# Patient Record
Sex: Female | Born: 1935 | Race: White | Hispanic: No | Smoking: Never smoker
Health system: Southern US, Community
[De-identification: ages and names within clinical notes are randomized; demographics above are authoritative.]

## PROBLEM LIST (undated history)

## (undated) DIAGNOSIS — E559 Vitamin D deficiency, unspecified: Secondary | ICD-10-CM

## (undated) DIAGNOSIS — M8580 Other specified disorders of bone density and structure, unspecified site: Secondary | ICD-10-CM

## (undated) DIAGNOSIS — I10 Essential (primary) hypertension: Secondary | ICD-10-CM

## (undated) DIAGNOSIS — K921 Melena: Secondary | ICD-10-CM

## (undated) DIAGNOSIS — I471 Supraventricular tachycardia, unspecified: Secondary | ICD-10-CM

## (undated) DIAGNOSIS — N183 Chronic kidney disease, stage 3 unspecified: Secondary | ICD-10-CM

## (undated) DIAGNOSIS — I2510 Atherosclerotic heart disease of native coronary artery without angina pectoris: Secondary | ICD-10-CM

## (undated) DIAGNOSIS — M1990 Unspecified osteoarthritis, unspecified site: Secondary | ICD-10-CM

## (undated) DIAGNOSIS — G2581 Restless legs syndrome: Secondary | ICD-10-CM

## (undated) DIAGNOSIS — K219 Gastro-esophageal reflux disease without esophagitis: Secondary | ICD-10-CM

## (undated) DIAGNOSIS — Z860100 Personal history of colon polyps, unspecified: Secondary | ICD-10-CM

## (undated) DIAGNOSIS — E039 Hypothyroidism, unspecified: Secondary | ICD-10-CM

## (undated) DIAGNOSIS — E079 Disorder of thyroid, unspecified: Secondary | ICD-10-CM

## (undated) DIAGNOSIS — E785 Hyperlipidemia, unspecified: Secondary | ICD-10-CM

## (undated) HISTORY — PX: CARDIAC CATHETERIZATION: SHX172

## (undated) HISTORY — DX: Disorder of thyroid, unspecified: E07.9

## (undated) HISTORY — DX: Other specified disorders of bone density and structure, unspecified site: M85.80

## (undated) HISTORY — DX: Atherosclerotic heart disease of native coronary artery without angina pectoris: I25.10

## (undated) HISTORY — DX: Supraventricular tachycardia: I47.1

## (undated) HISTORY — PX: OTHER SURGICAL HISTORY: SHX169

## (undated) HISTORY — DX: Personal history of colon polyps, unspecified: Z86.0100

## (undated) HISTORY — DX: Melena: K92.1

## (undated) HISTORY — DX: Personal history of colonic polyps: Z86.010

## (undated) HISTORY — PX: PERIPHERAL IRIDOTOMY: SHX5414

## (undated) HISTORY — DX: Restless legs syndrome: G25.81

## (undated) HISTORY — DX: Hyperlipidemia, unspecified: E78.5

## (undated) HISTORY — DX: Vitamin D deficiency, unspecified: E55.9

## (undated) HISTORY — PX: DILATION AND CURETTAGE OF UTERUS: SHX78

## (undated) HISTORY — DX: Supraventricular tachycardia, unspecified: I47.10

## (undated) HISTORY — PX: TUBAL LIGATION: SHX77

## (undated) HISTORY — PX: JOINT REPLACEMENT: SHX530

## (undated) HISTORY — PX: TOTAL HIP ARTHROPLASTY: SHX124

## (undated) HISTORY — PX: HIP SURGERY: SHX245

## (undated) HISTORY — DX: Essential (primary) hypertension: I10

## (undated) HISTORY — PX: KNEE ARTHROSCOPY: SUR90

---

## 1967-12-28 HISTORY — PX: THYROIDECTOMY, PARTIAL: SHX18

## 2000-05-10 ENCOUNTER — Ambulatory Visit (HOSPITAL_COMMUNITY): Admission: RE | Admit: 2000-05-10 | Discharge: 2000-05-10 | Payer: Self-pay | Admitting: Obstetrics and Gynecology

## 2000-05-10 ENCOUNTER — Encounter (INDEPENDENT_AMBULATORY_CARE_PROVIDER_SITE_OTHER): Payer: Self-pay

## 2002-12-06 ENCOUNTER — Other Ambulatory Visit: Admission: RE | Admit: 2002-12-06 | Discharge: 2002-12-06 | Payer: Self-pay | Admitting: Obstetrics and Gynecology

## 2003-01-08 ENCOUNTER — Encounter: Payer: Self-pay | Admitting: Orthopedic Surgery

## 2003-01-14 ENCOUNTER — Encounter: Payer: Self-pay | Admitting: Orthopedic Surgery

## 2003-01-14 ENCOUNTER — Inpatient Hospital Stay (HOSPITAL_COMMUNITY): Admission: RE | Admit: 2003-01-14 | Discharge: 2003-01-18 | Payer: Self-pay | Admitting: Orthopedic Surgery

## 2003-12-09 ENCOUNTER — Other Ambulatory Visit: Admission: RE | Admit: 2003-12-09 | Discharge: 2003-12-09 | Payer: Self-pay | Admitting: Obstetrics and Gynecology

## 2004-12-10 ENCOUNTER — Other Ambulatory Visit: Admission: RE | Admit: 2004-12-10 | Discharge: 2004-12-10 | Payer: Self-pay | Admitting: *Deleted

## 2005-04-05 ENCOUNTER — Inpatient Hospital Stay (HOSPITAL_COMMUNITY): Admission: RE | Admit: 2005-04-05 | Discharge: 2005-04-08 | Payer: Self-pay | Admitting: Orthopedic Surgery

## 2005-10-27 ENCOUNTER — Emergency Department (HOSPITAL_COMMUNITY): Admission: EM | Admit: 2005-10-27 | Discharge: 2005-10-27 | Payer: Self-pay | Admitting: Emergency Medicine

## 2005-12-13 ENCOUNTER — Other Ambulatory Visit: Admission: RE | Admit: 2005-12-13 | Discharge: 2005-12-13 | Payer: Self-pay | Admitting: Obstetrics and Gynecology

## 2006-02-02 ENCOUNTER — Encounter (INDEPENDENT_AMBULATORY_CARE_PROVIDER_SITE_OTHER): Payer: Self-pay | Admitting: *Deleted

## 2006-02-02 ENCOUNTER — Ambulatory Visit (HOSPITAL_COMMUNITY): Admission: RE | Admit: 2006-02-02 | Discharge: 2006-02-02 | Payer: Self-pay | Admitting: Gastroenterology

## 2006-05-20 IMAGING — CR DG CHEST 2V
2 series · 2 of 2 positions shown · non-contrast
Comparison: none

CLINICAL DATA: Osteoarthritis right hip.  Preoperative respiratory evaluation.  No chest complaints.
 TWO VIEW CHEST:
 Comparison is made to prior study dated 01/08/03.
 Heart size is normal.  Aorta is unfolded.  Lungs are well-expanded and free of active disease.  No pleural effusion is noted.  There is degenerative change in the spine.  Soft tissues are normal.

[view not recorded (1 of 2)]
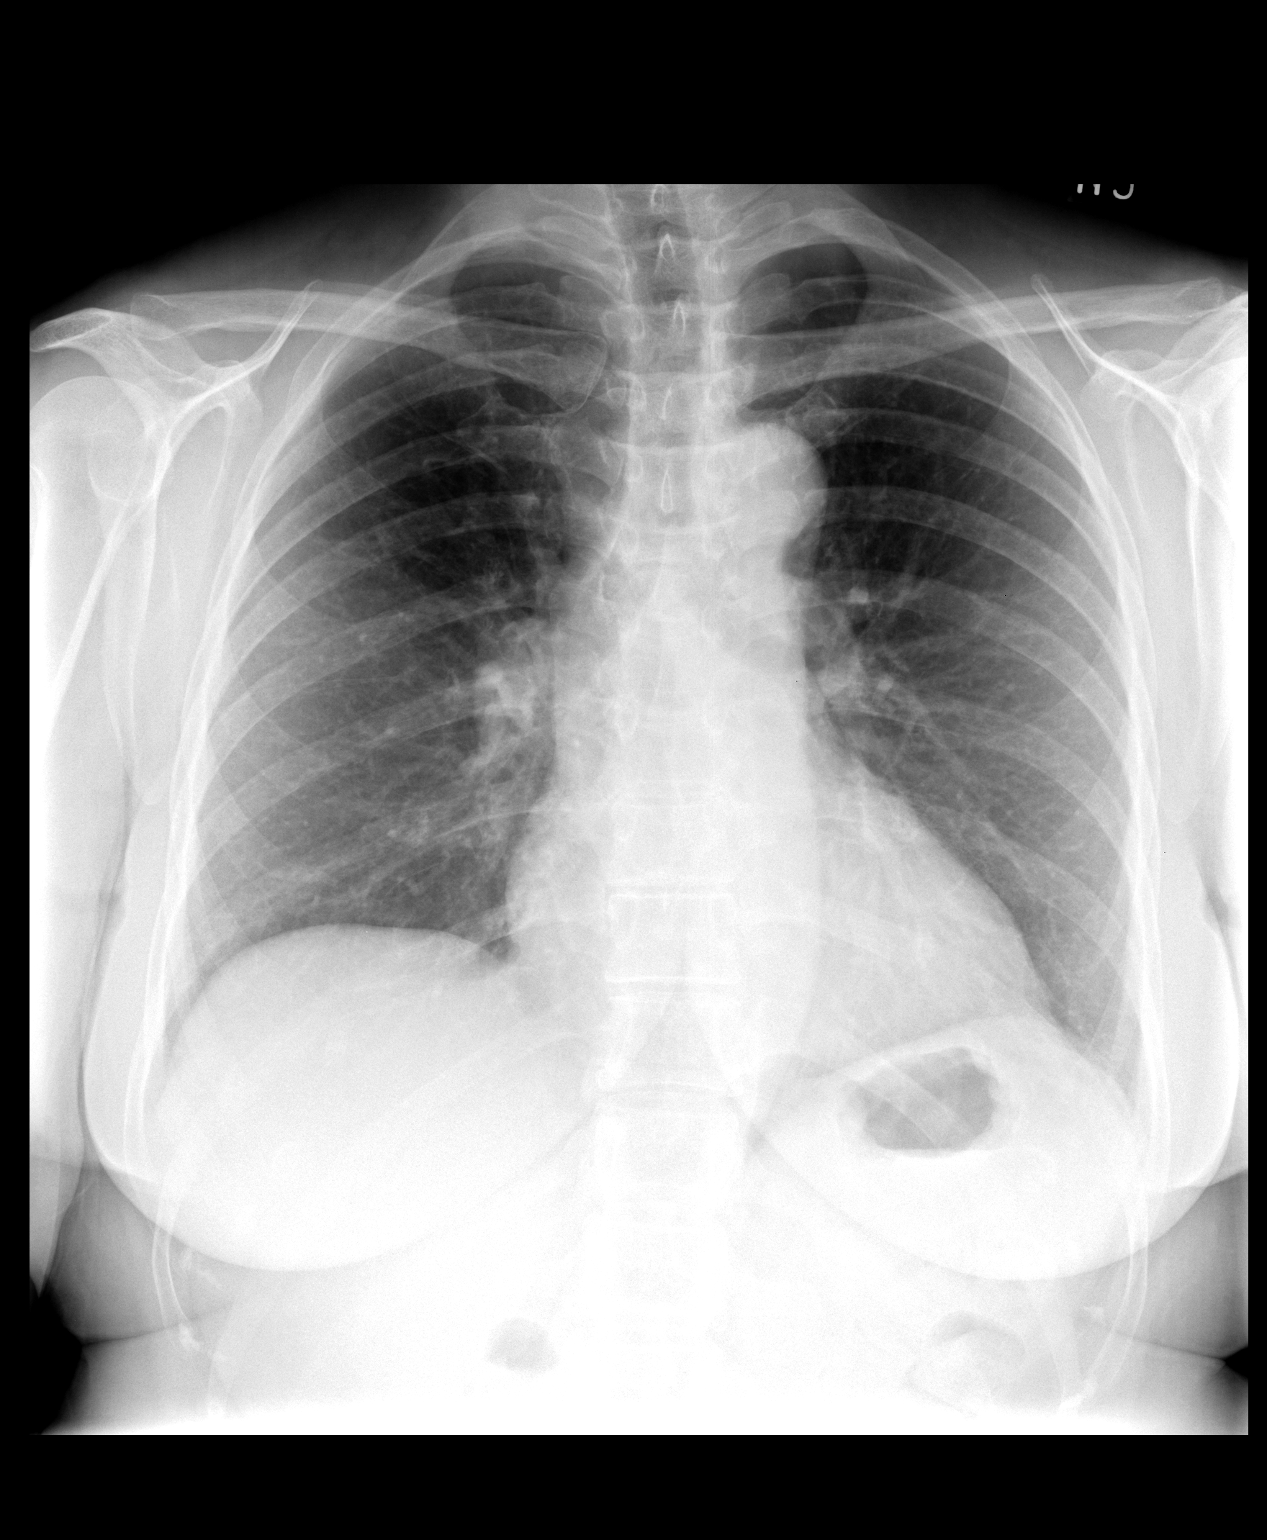

[view not recorded (2 of 2)]
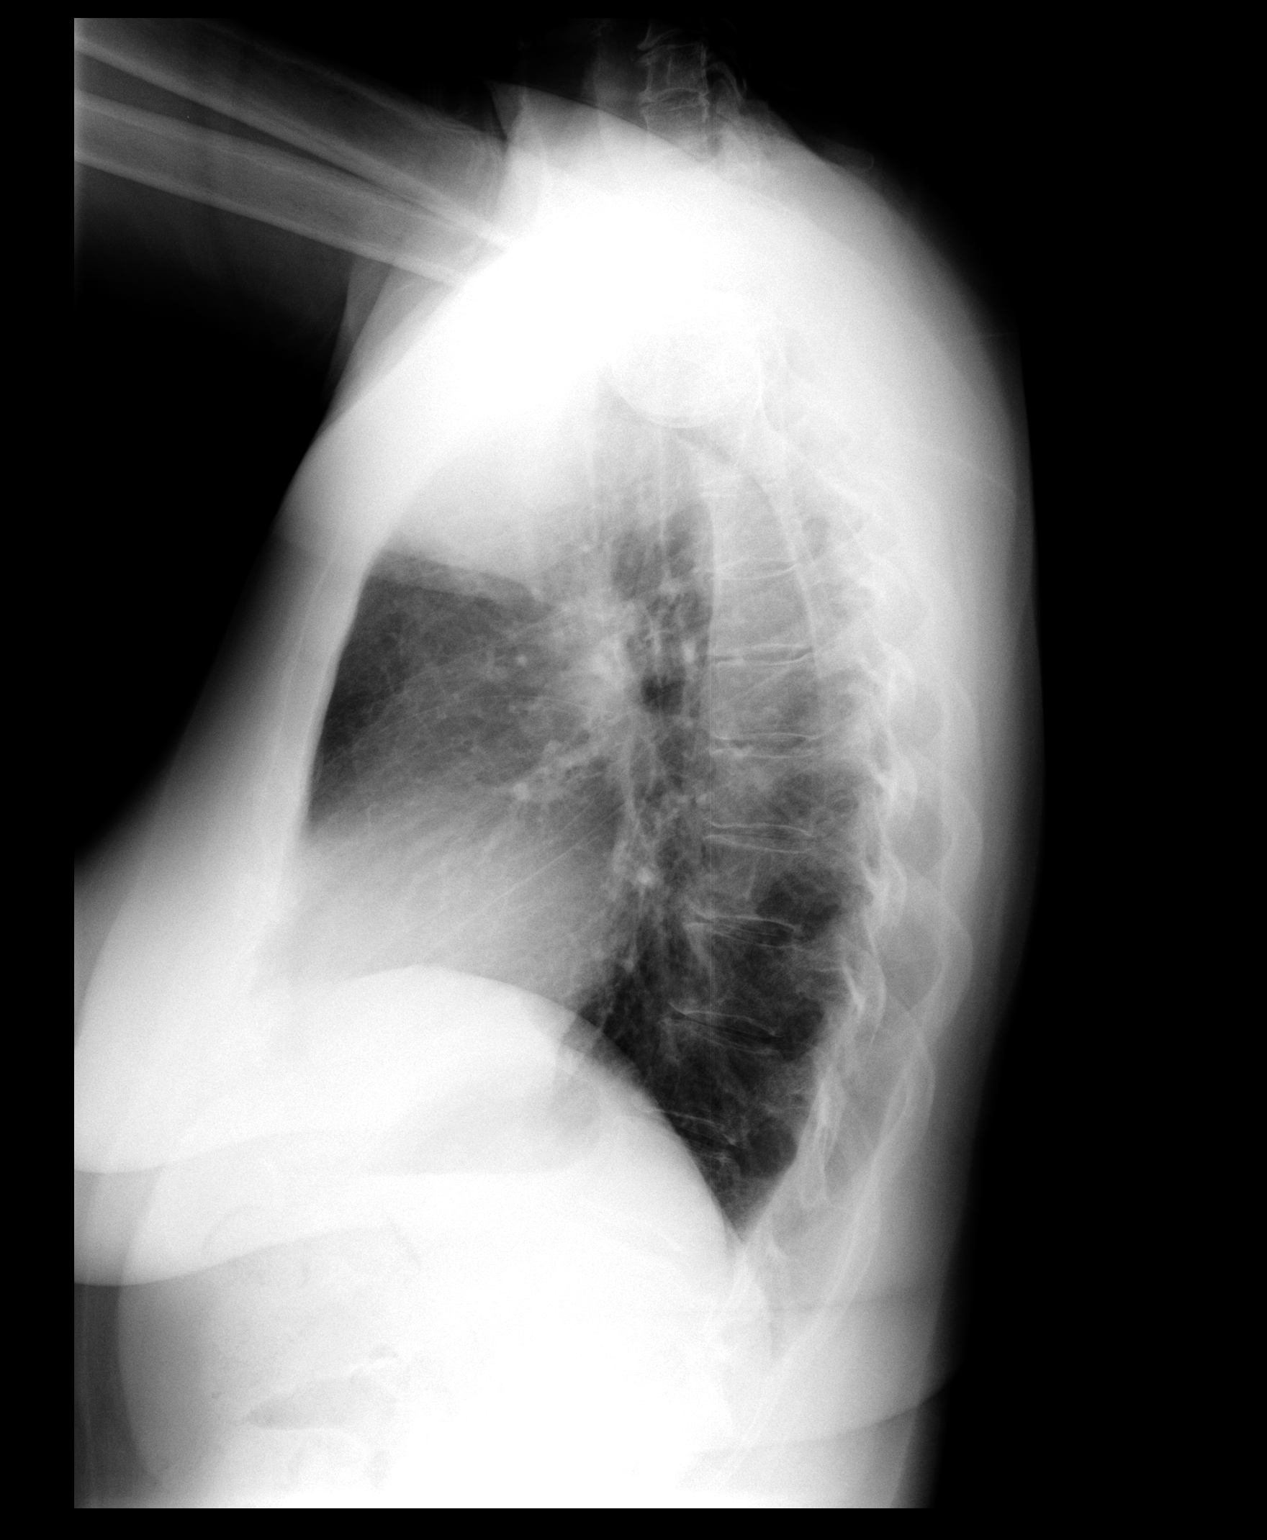

[2 of 2 positions shown; findings below may reference images not displayed]

IMPRESSION: No active cardiopulmonary disease.

## 2006-12-15 ENCOUNTER — Other Ambulatory Visit: Admission: RE | Admit: 2006-12-15 | Discharge: 2006-12-15 | Payer: Self-pay | Admitting: Obstetrics & Gynecology

## 2007-07-08 ENCOUNTER — Other Ambulatory Visit: Payer: Self-pay | Admitting: Cardiology

## 2007-07-08 ENCOUNTER — Inpatient Hospital Stay (HOSPITAL_COMMUNITY): Admission: EM | Admit: 2007-07-08 | Discharge: 2007-07-10 | Payer: Self-pay | Admitting: Emergency Medicine

## 2007-07-09 ENCOUNTER — Encounter (INDEPENDENT_AMBULATORY_CARE_PROVIDER_SITE_OTHER): Payer: Self-pay | Admitting: Cardiology

## 2007-08-24 ENCOUNTER — Ambulatory Visit: Payer: Self-pay | Admitting: Internal Medicine

## 2007-09-04 ENCOUNTER — Ambulatory Visit (HOSPITAL_COMMUNITY): Admission: RE | Admit: 2007-09-04 | Discharge: 2007-09-04 | Payer: Self-pay | Admitting: Endocrinology

## 2007-09-28 ENCOUNTER — Ambulatory Visit (HOSPITAL_COMMUNITY): Admission: RE | Admit: 2007-09-28 | Discharge: 2007-09-28 | Payer: Self-pay | Admitting: Orthopedic Surgery

## 2008-01-19 ENCOUNTER — Other Ambulatory Visit: Admission: RE | Admit: 2008-01-19 | Discharge: 2008-01-19 | Payer: Self-pay | Admitting: Obstetrics & Gynecology

## 2009-01-24 ENCOUNTER — Other Ambulatory Visit: Admission: RE | Admit: 2009-01-24 | Discharge: 2009-01-24 | Payer: Self-pay | Admitting: Obstetrics & Gynecology

## 2011-01-26 ENCOUNTER — Other Ambulatory Visit: Payer: Self-pay | Admitting: Physical Medicine and Rehabilitation

## 2011-01-26 DIAGNOSIS — M545 Low back pain: Secondary | ICD-10-CM

## 2011-01-27 ENCOUNTER — Ambulatory Visit
Admission: RE | Admit: 2011-01-27 | Discharge: 2011-01-27 | Disposition: A | Discharge status: Home / Self Care | Payer: Medicare Other | Source: Ambulatory Visit | Attending: Physical Medicine and Rehabilitation | Admitting: Physical Medicine and Rehabilitation

## 2011-01-27 DIAGNOSIS — M545 Low back pain, unspecified: Secondary | ICD-10-CM

## 2011-02-16 ENCOUNTER — Other Ambulatory Visit: Payer: Self-pay | Admitting: Specialist

## 2011-02-16 ENCOUNTER — Ambulatory Visit (HOSPITAL_COMMUNITY)
Admission: RE | Admit: 2011-02-16 | Discharge: 2011-02-16 | Disposition: A | Discharge status: Home / Self Care | Payer: Medicare Other | Source: Ambulatory Visit | Attending: Specialist | Admitting: Specialist

## 2011-02-16 ENCOUNTER — Other Ambulatory Visit (HOSPITAL_COMMUNITY): Payer: Self-pay | Admitting: Specialist

## 2011-02-16 ENCOUNTER — Encounter (HOSPITAL_COMMUNITY): Payer: Medicare Other

## 2011-02-16 DIAGNOSIS — Z01818 Encounter for other preprocedural examination: Secondary | ICD-10-CM

## 2011-02-16 DIAGNOSIS — Z01812 Encounter for preprocedural laboratory examination: Secondary | ICD-10-CM | POA: Insufficient documentation

## 2011-02-16 DIAGNOSIS — M7130 Other bursal cyst, unspecified site: Secondary | ICD-10-CM | POA: Insufficient documentation

## 2011-02-16 DIAGNOSIS — M47817 Spondylosis without myelopathy or radiculopathy, lumbosacral region: Secondary | ICD-10-CM | POA: Insufficient documentation

## 2011-02-16 LAB — CBC
HCT: 44.3 % (ref 36.0–46.0)
MCH: 31 pg (ref 26.0–34.0)
MCHC: 33 g/dL (ref 30.0–36.0)
Platelets: 222 10*3/uL (ref 150–400)
RDW: 13.5 % (ref 11.5–15.5)
WBC: 7.7 10*3/uL (ref 4.0–10.5)

## 2011-02-16 LAB — COMPREHENSIVE METABOLIC PANEL
Albumin: 3.7 g/dL (ref 3.5–5.2)
BUN: 22 mg/dL (ref 6–23)
Calcium: 9.4 mg/dL (ref 8.4–10.5)
Creatinine, Ser: 1.12 mg/dL (ref 0.4–1.2)
Total Protein: 7.2 g/dL (ref 6.0–8.3)

## 2011-02-16 LAB — APTT: aPTT: 32 seconds (ref 24–37)

## 2011-02-16 LAB — URINALYSIS, ROUTINE W REFLEX MICROSCOPIC
Hgb urine dipstick: NEGATIVE
Urine Glucose, Fasting: NEGATIVE mg/dL
pH: 5.5 (ref 5.0–8.0)

## 2011-02-16 LAB — PROTIME-INR
INR: 1.01 (ref 0.00–1.49)
Prothrombin Time: 13.5 seconds (ref 11.6–15.2)

## 2011-02-17 ENCOUNTER — Observation Stay (HOSPITAL_COMMUNITY)
Admission: RE | Admit: 2011-02-17 | Discharge: 2011-02-18 | Disposition: A | Discharge status: Home / Self Care | Payer: Medicare Other | Source: Ambulatory Visit | Attending: Specialist | Admitting: Specialist

## 2011-02-17 ENCOUNTER — Ambulatory Visit (HOSPITAL_COMMUNITY): Payer: Medicare Other

## 2011-02-17 ENCOUNTER — Other Ambulatory Visit: Payer: Self-pay | Admitting: Specialist

## 2011-02-17 DIAGNOSIS — M7130 Other bursal cyst, unspecified site: Secondary | ICD-10-CM | POA: Insufficient documentation

## 2011-02-17 DIAGNOSIS — M48061 Spinal stenosis, lumbar region without neurogenic claudication: Principal | ICD-10-CM | POA: Insufficient documentation

## 2011-02-17 DIAGNOSIS — M79609 Pain in unspecified limb: Secondary | ICD-10-CM | POA: Insufficient documentation

## 2011-02-17 DIAGNOSIS — Z79899 Other long term (current) drug therapy: Secondary | ICD-10-CM | POA: Insufficient documentation

## 2011-02-17 DIAGNOSIS — I10 Essential (primary) hypertension: Secondary | ICD-10-CM | POA: Insufficient documentation

## 2011-02-17 HISTORY — PX: LUMBAR LAMINECTOMY: SHX95

## 2011-02-24 NOTE — Op Note (Signed)
NAMEDRUCELLA, KARBOWSKI              ACCOUNT NO.:  [id number]  MEDICAL RECORD NO.:  [id number]           PATIENT TYPE:  I  LOCATION:  1604                         FACILITY:  [facility]  PHYSICIAN:  Jene Every, M.D.    DATE OF BIRTH:  [birth year]  DATE OF PROCEDURE:  02/17/2011 DATE OF DISCHARGE:  02/18/2011                              OPERATIVE REPORT   PREOPERATIVE DIAGNOSES: 1. Spinal stenosis, L5-S1 left. 2. Synovial cyst, left L5-S1.  PROCEDURE FORMED: 1. Micro decompression, L5-S1 left, with foraminotomies of L5 and S1. 2. Excision of synovial cyst and partial medial hemi facetectomy.  ANESTHESIA:  General.  ASSISTANT:  Roma Schanz, P.A.  BRIEF HISTORY:  This is a pleasant 76 year old with left lower extremity radicular pain, S1 nerve root distribution and L5.  MRI indicating synovial cyst compressing the S1 nerve root and L5.  She had severe left lower extremity radicular pain.  She was indicated for a decompression failing conservative treatment.  Risks and benefits discussed including bleeding, infection, damage to neurovascular structures, no change in symptoms, worsening symptoms, need for repeat debridement, DVT, PE, anesthetic complications etc.  We also recommended synovial cyst removal and biopsy.  TECHNIQUE:  With the patient in supine position after induction of adequate anesthesia and 2 g of Kefzol, the patient was placed prone on the [place] frame.  All bony prominences well padded.  Lumbar region was prepped and draped in usual sterile fashion.  A 20-gauge spinal needle was utilized to localize L5-S1 interspace, confirmed with x-ray. Incision was made from spinous process, L5-S1.  Subcutaneous tissue was dissected.  Electrocautery was utilized to achieve hemostasis. Dorsolumbar fascia was identified by the line of skin incision. Paraspinous muscle elevated from lamina of L5 and S1.  Operating microscope was draped and brought in the surgical field.  A  straight curette was utilized to detach ligamentum flavum from the cephalad edge of S1.  Foraminotomy of S1 was performed.  Hemilaminotomy at the caudad edge of L5 was performed as well.  Detached the ligamentum flavum. There was a large cystic structure appearing from the facet of L5-S1 medially.  After removal of ligamentum flavum, we identified the thecal sac and the S1 nerve root.  The thecal sac was adhesed to the synovial cyst.  We performed a partial medial hemi facetectomy removing a portion of the facet and the cystic structure.  After isolating the cyst and decompressing the cyst, we removed meticulously the cyst, separated it from the thecal sac.  The outer portion of the thecal sac remained adhered to the thecal sac that we felt was not providing any compression and attempting to remove it would only create inadvertent durotomy.  We removed the remainder of the cyst and sent it to pathology for appropriate evaluation.  I decompressed the lateral recess in the medial border of pedicle with some compression of the fiber root into the foramen.  We performed foraminotomy of L5 as well.  After that, a hockey stick probe passed freely up the foramen of L5 and S1 and cephalad to the pedicle of L5 we had a least 1 cm excursion of the S1  nerve root medial to the pedicle without difficulty.  There was no disk herniation noted.  Copiously irrigated the space.  No evidence of CSF leakage or active bleeding.  Thrombin-soaked Gelfoam was placed in the laminotomy defect.  Confirmatory radiographs obtained at L5-S1 with a marker. McCullough retractor was removed.  No evidence of active bleeding. Copiously irrigated the paraspinous musculature, repaired the dorsolumbar fascia with #1 Vicryl interrupted figure-of-eight sutures, subcu with 2-0 Vicryl simple sutures.  Skin was reapproximated with 4-0 subcuticular Prolene.  Wound reinforced with Steri-Strips.  Sterile dressing applied.  Placed  supine on the hospital bed, extubated without difficulty and transported to recovery room in satisfactory condition.  The patient tolerated the procedure well.  No complications.  BLOOD LOSS:  10 cc.     Jene Every, M.D.     Cordelia Pen  D:  02/22/2011  T:  02/22/2011  Job:  [id number]  Electronically Signed by Jene Every M.D. on 02/24/2011 09:21:12 AM

## 2011-05-11 NOTE — Cardiovascular Report (Signed)
NAMETELSA, DILLAVOU              ACCOUNT NO.:  [id number]   MEDICAL RECORD NO.:  [id number]          PATIENT TYPE:  OIB   LOCATION:  NA                           FACILITY:  [facility]   PHYSICIAN:  [name], M.D.  DATE OF BIRTH:  1935-08-28   DATE OF PROCEDURE:  DATE OF DISCHARGE:                            CARDIAC CATHETERIZATION   PROCEDURES PERFORMED:  1. Left heart catheterization.  2. Left ventriculogram.  3. Coronary angiography.   INDICATIONS:  [name] is a 76 year old woman who was admitted on  July 07, 2007 with complaints of abdominal pain, and she was found to be  in a supraventricular tachycardia at a rate of 180 beats per minute.  She did not had any symptoms specifically referable to this.  Subsequent  cardiac enzymes were elevated into the diagnostic range.  A troponin was  as high as 2.5.  There were no ischemic electrocardiographic changes on  baseline ECG.  During the SVT, there was deep ST depression in the  lateral precordial leads.  She was brought to catheterization laboratory  at this time, after being treated over the weekend with IV heparin and  aspirin, to identify the extent of disease and provide for further  therapeutic options.   PROCEDURE NOTE:  The patient was brought to cardiac catheterization  laboratory in the fasting state.  The right groin was prepped and draped  in the usual sterile fashion.  Local anesthesia was obtained with  infiltration of 1% lidocaine.  A 6-French catheter sheath was inserted  percutaneously into the right femoral artery, utilizing an anterior  approach over guiding J-wire.  Left heart catheterization and coronary  angiography then proceeded in a standard fashion, using 6-French #4 left  and right Judkins catheters and a 110-cm pigtail catheter.  A 30-degree  RAO cine left ventriculogram was performed, utilizing a power injector.  Forty-two milliliters were injected at 13 mL per second.  At the  completion of  this procedure, a right femoral arteriogram in the 45-  degree RAO angulation via the catheter sheath by hand injection  documented adequate anatomy for placement of the percutaneous closure  device Angio-Seal.  This was subsequently deployed with good hemostasis  and an intact distal pulse.   HEMODYNAMIC RESULTS:  Systemic arterial pressure was 123/69 with a mean  of 89 mmHg.  There was no systolic gradient across the aortic valve.  The left ventricular end-diastolic pressure was 12 mmHg pre-  ventriculogram.   Angiography.  The left ventriculogram was suboptimal, due to tachycardia  produced during the injection.  No clear regional wall motion  abnormality is seen.  A visual estimate ejection fraction is 65-70%.  There is no coronary calcification seen.  Seen.  The aortic valve is  trileaflet and opens normally during systole.   There is a right dominant coronary system present.  The main left  coronary artery is normal.   The left anterior descending artery and its branches are minimally  diseased; in the proximal segment, there is a 30% eccentric tubular  narrowing starting at the ostium and extending for about 10 to  12 mm.  The anterior descending artery then gives rise to a large diagonal  branch, and the ongoing anterior descending artery reaches and traverses  the apex.  There are no significant structures in the mid or distal  anterior descending or its diagonal branch.   The left circumflex coronary artery and its branches are generally free  of any visible disease.  Three substantial marginals arise from the  trunk of the circumflex, and then there is one small marginal which is  the third marginal branch.  These marginal branches are extremely  tortuous and have a relatively small caliber.  I can identify no  significant obstruction in these vessels.   The right coronary artery and its branches again are a relatively small  caliber, but no fixed obstructive disease is  seen whatsoever. The distal  right coronary and gives rise to a moderate-sized posterior descending  artery and a small posterolateral segment with a single small left  ventricular branch.  Again, there are no obstructions in these distal  vessels.   Collateral vessels are not seen.   FINAL IMPRESSION:  1. Intact ventricular size and global systolic function, ejection      fraction 65-70%.  2. No significant obstructive coronary artery seen.  General diffuse      small caliber vessels are present.   PLAN:  The patient is presented with this gratifying news.  It would  seem that her elevated cardiac enzymes are due to either spasm or  spontaneous thrombosis with resolution.  Also, possibility of an  embolism to a small distal branch during her SVT.  The medications will  be adjusted such that Benicar is exchanged for metoprolol 25 mg p.o.  b.i.d., and she will be followed in the outpatient setting.      [name], M.D.  Electronically Signed     JHE/MEDQ  D:  07/10/2007  T:  07/10/2007  Job:  [id number]   cc:   Jeannett Senior A. Evlyn Kanner, M.D.

## 2011-05-11 NOTE — Op Note (Signed)
NAMECEARA, WRIGHTSON              ACCOUNT NO.:  [id number]   MEDICAL RECORD NO.:  [id number]          PATIENT TYPE:  OIB   LOCATION:  2550                         FACILITY:  [facility]   PHYSICIAN:  Ollen Gross, M.D.    DATE OF BIRTH:  June 30, 1935   DATE OF PROCEDURE:  09/28/2007  DATE OF DISCHARGE:  09/28/2007                               OPERATIVE REPORT   PREOPERATIVE DIAGNOSIS:  Right knee lateral meniscal tear.   POSTOPERATIVE DIAGNOSIS:  Right knee lateral meniscal tear, chondral  defect medial femoral condyle.   PROCEDURE:  Right knee arthroscopy with meniscal debridement  chondroplasty.   SURGEON:  Ollen Gross, M.D., no assistant.   ANESTHESIA:  General and local block.   ESTIMATED BLOOD LOSS:  Minimal.   DRAINS:  None.   COMPLICATIONS:  None.   CONDITION:  Stable to recovery.   BRIEF CLINICAL NOTE:  Ms. Deniston is a 76 year old female with several  month history progressively worsening right knee pain, mechanical  symptoms and dysfunction.  She has failed nonoperative management  including injection and presents for knee arthroscopy.   PROCEDURE IN DETAIL:  After successful administration of local block and  spinal anesthetic, a tourniquet placed on the right thigh, right lower  extremity prepped and draped in the usual sterile fashion.  Standard  superomedial and inferolateral incisions made, inflow cannula passed  superomedial and camera passed inferolateral.  Arthroscopic  visualization proceeds.  Undersurface patella has minimal  chondromalacia.  The trochlea has a small area of cartilage that has  peeled off from the underlying cartilage but it is only about 2 x 2 mm.  The rest of the trochlea looks unremarkable.  The mediolateral gutters  were visualized.  There were no loose bodies.  Flexion and valgus forces  applied to the knee and the medial compartment was centered.  This no  tear in the medial meniscus.  Spinal needle was used to localize the  inferomedial portal small incision made, dilator placed and the shaver  placed.  There was evidence of about 1 x 2 cm area of chondral  delamination off of the medial femoral condyle.  This debrided back to a  stable bony base with stable cartilaginous edges and abraded bone.  The  surrounding cartilage does appear normal.  Intercondylar notch was  visualized, ACL looks normal.  Lateral compartment centered.  Bone-on-  bone change in the lateral compartment.  There is evidence of a  significant unstable tear, body and posterior horn of the lateral  meniscus.  This debrided back to stable base with baskets and sealed off  with the 4.2 mm shaver.  The remainder of the meniscus was stable but  once again, there are bone-on-bone changes in the lateral compartment.  The rest of the joint inspected and I did shave that small area of  unstable cartilage in the trochlea.  The remainder trochlea looks fine.  There is no other evidence of loose bodies or cartilage tears throughout  the joint.  The  arthroscopic was then removed from inferior portals which closed with  interrupted 4-0 nylon.  20 mL of  0.25% Marcaine with epi were injected  through the inflow cannula, then that is removed and that portal closed  with nylon.  A bulky sterile dressing is applied and she is awakened and  transferred to recovery in stable condition.      Ollen Gross, M.D.  Electronically Signed     FA/MEDQ  D:  09/28/2007  T:  09/29/2007  Job:  [id number]

## 2011-05-11 NOTE — Letter (Signed)
August 24, 2007    Jeannett Senior A. Evlyn Kanner, M.D.  [address]  [place], [place] [address]   RE:  CHRISTINEA, BRIZUELA  MRN:  [id number]  /  DOB:  [birth year]   Dear Brett Canales:   Mrs. Hovland came in following a recent hospitalization, at which time  she presented with supraventricular tachycardia and chest pain.  She had  no palpitations, but she did bump her troponin 2.5.  She underwent  catheterization, which demonstrated no obstructive disease.   She comes in today to discuss catheter ablation.  We discussed a variety  of treatment options, including continuing on her beta blockers as well  as catheter ablation.  We discussed the potential benefits as well as  the potential risks including, but not limited to death, perforation,  and heart block requiring pacemaker implantation.  She would like to  proceed with catheter ablation so as not to have to worry about it.  She  is concerned about her husband with Alzheimer's, and that she should do  things now that she should do as opposed to waiting.   EXAMINATION:  Today, her blood pressure is 132/76 with a pulse of 66.  LUNGS:  Clear.  HEART:  Sounds were regular.  EXTREMITIES:  Without edema.   IMPRESSION:  1. Supraventricular tachycardia with a question of a pseudo R-prime.  2. No palpitations associated with #1, rather surprisingly, suggestive      of an atrioventricular reciprocating tachycardia.  3. Positive troponin associated with #1, but no evidence of      obstructive coronary artery disease at catheterization.   Will plan to schedule this in early October at her request.   I will keep you abreast.  Thank you.    Sincerely,      Duke Salvia, MD, [facility]  Electronically Signed    SCK/MedQ  DD: 08/24/2007  DT: 08/25/2007  Job #: [id number]   CC:    Francisca December, M.D.

## 2011-05-11 NOTE — Consult Note (Signed)
NAMEANNDEE, CONNETT NO.:  [id number]   MEDICAL RECORD NO.:  [id number]          PATIENT TYPE:  OBV   LOCATION:  1422                         FACILITY:  [facility]   PHYSICIAN:  Duke Salvia, MD, FACCDATE OF BIRTH:  April 23, 1935   DATE OF CONSULTATION:  DATE OF DISCHARGE:                                 CONSULTATION   Thank you very much for asking [place] to see [name] in cardiac  consultation because of adenosine-responsive tachycardia.   [name] is a 76 year old woman who is the married mother of one,  whose husband unfortunately has had progressive dementia, who awakened  this morning with episodic lower abdominal pain.  It came and went for  about 5 or 6 hours, and she presented resolve to Dr. Rinaldo Cloud office  earlier today.  At that time, she was found to be in a tachycardia with  a second length of about 300 milliseconds.  She was given adenosine with  termination of the tachycardia, and she was referred to the emergency  room.   Somewhat surprisingly, her troponins came back abnormal.  Initially,  level was 0.12, subsequent level of 0.31.  CK-MB was negative.  First  myoglobin was negative.  Second myoglobin was borderline positive.   The patient's abdominal discomfort remained in a waxing waning phase,  until long after the tachycardia had terminated.   The patient surprisingly had no palpitations with a tachycardia, except  for very briefly.  She has no antecedent history of palpitations.   Her cardiac risk factors were notable for:  a.  Hypercholesterolemia.  b.  Hypertension/  c.  Family history, and she has been intolerant of Statins.   On evaluation today, her blood sugar is elevated.   PAST MEDICAL HISTORY:  Largely negative, apart from:  1)  thyroid  surgery, 2) bilateral hip surgery and the impending knee surgery as  noted.   REVIEW OF SYSTEMS:  Broadly negative, apart from remote GE reflux  disease and hypoparathyroidism related to  her thyroid surgery, for which  she takes supplementation.   MEDICATIONS:  Include Benicar, she thinks a dose of 40/12.5.  Calcitriol, calcium, aspirin currently on hold for surgery, fish oil.  She takes thyroid at 100 mcg a day.   ALLERGIES:  She has no known drug allergies.   SOCIAL HISTORY:  Primarily as noted above.  She does not use cigarettes,  alcohol or recreational drugs.  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 Juanda Chance.   EXAMINATION:  GENERAL:  She is an elderly Caucasian female appearing her  stated age of 6  VITAL SIGNS:  Her heart rate was 102.  Her blood pressure was 137/64.  HEENT:  Demonstrated no trismus, xanthoma.  NECK:  The neck veins were flat.  Carotids are brisk and full  bilaterally without bruits.  BACK:  Without kyphosis or scoliosis.  LUNGS:  Were clear.  HEART:  Sounds were regular without murmurs or gallops.  ABDOMEN:  Soft with active bowel sounds, without midline pulsation or  hepatomegaly.  There is mild  lower abdominal tenderness.  Femoral pulses  were 2+.  Distal pulses were intact with no clubbing, cyanosis or edema.  NEUROLOGICAL:  Exam was grossly normal.   I have come across a stress test from Dr. [place] dated January 2007 that  was negative with ejection fraction of 61%.  I did not see that  initially.  Electrocardiogram that point had PVCs with a left bundle  inferior axis morphology.   Electrocardiogram dated today demonstrated narrow QRS tachycardia with a  cycle length of approximately 320 milliseconds or so. There is a P-wave  inscribed in the proximal portion of the ST-segment, created a pseudo R  prime.  On the rhythm tracings, it appears as if the P-wave is somewhat  later than that, is actually a negative deflection started about 120  milliseconds after the onset of the QRS.   IMPRESSION:  1. Narrow QRS tachycardia with retrograde P-wave, clearly the ST-      segment, surprisingly without  symptoms of palpitations that was      adenosine-responsive.  2. Abnormal troponins initially 0.121 going to 0.31, was initially      negative, and then borderline-positive myoglobin.  3. Cardiac risk factors notable for:      a.     Hypokalemia.      b.     Hypertension.      c.     Family history.      d.     Borderline elevated blood sugar.  4. Mild renal insufficiency with a creatinine clearance of 45.  5. Recurrent abdominal pain - presenting symptom, now abated.  6. Intolerance to statins.  7. Impending knee surgery.   DISCUSSION:  [name] has a the tachycardia and positive troponins.  It is certainly possible that the tachycardia is simply related to  demand.  However, with her cardiac risk factors and her pending surgery,  some kind of risk stratification process will be necessary.  I initially  thought in terms of catheterization.  I spoke to Dr. Amil Amen who will be  taking over for Dr. [place] this evening and will defer further  evaluation of that to him.   As it relates to the tachycardia, I would favor the use of beta blocker,  especially with the impending surgery, as an alternative to the  Benicar/HCT.  She also has a resting tachycardia now, the explanation  for which I do not have.  I will plan to 1) check a D-dimer, 2)  anticoagulation per Dr. Amil Amen, 3) cardiac risk stratification and  preoperative assessment per Dr. Amil Amen, 4) longer term would suggest  that we anticipate undertaking catheter ablation, given the fact that  she has positive troponins with a largely asymptomatic tachycardia, and  the risks of that are un-quantifiable but certainly probably greater  than the risks associated with the procedure.   To that end, I will plan to see her again in the office in about 4 to 5  weeks, and I will take care of arranging that followup.   Thank you for the consultation.      Duke Salvia, MD, [facility]  Electronically Signed     SCK/MEDQ  D:   07/07/2007  T:  07/09/2007  Job:  [id number]   cc:   Tera Mater. Evlyn Kanner, M.D.  Fax: [phone]

## 2011-05-11 NOTE — Discharge Summary (Signed)
[name], [name]              ACCOUNT NO.:  [id number]   MEDICAL RECORD NO.:  [id number]          PATIENT TYPE:  INP   LOCATION:  1422                         FACILITY:  [facility]   PHYSICIAN:  Francisca December, M.D.  DATE OF BIRTH:  1935-09-08   DATE OF ADMISSION:  07/07/2007  DATE OF DISCHARGE:  07/10/2007                               DISCHARGE SUMMARY   DISCHARGE DIAGNOSES:  1. Elevated troponin.  2. Nonobstructive coronary artery disease.  3. Hypertension.  4. Hypothyroidism.  5. Hypertriglyceridemia.  6. Degenerative joint disease.  7. Restless leg syndrome.   HOSPITAL COURSE:  [name] is a 76 year old  female who initially  went to Dr. Rinaldo Cloud  office with abdominal pain and on admission she was  found to be in an SVT rhythm at a rate of 185 beats per minute.  She was  transported by EMS and then received adenosine which converted her back  to sinus rhythm.  She denies any chest pain, pressure, tachy  palpitations, lightheadedness, or syncope.   Ultimately during her hospitalization, she received an EP consult from  Dr. Sherryl Manges who recommended changing her previous dose of Benicar  over to a beta blocker.  He then suggested the patient follow up with  himself for a work-up as an outpatient.   As part of the patient's hospitalization, D-dimer was elevated at 2.29  and a CT scan of the chest was performed.  CT scan of the chest showed  no evidence of pulmonary emboli.  Because of the patient's abdominal  pain, a CT scan of the abdomen and pelvis was performed and there was  nothing pertinent with those results as well.   On July 10, 2007, the patient underwent cardiac catheterization under  the care of Dr. Corliss Marcus.  The patient had the following:  Normal  left ventricular function, left main okay, LAD 20% proximal lesion, left  circumflex and right coronary artery had no significant disease.   Part of the hospitalization included lab work which showed  sodium 139,  potassium 3.6, BUN 26, creatinine 1.19.  LFTs essentially normal.  CK-MB  normal with a maximum troponin, however, at 3.58.  D-dimer 2.29. White  count 5.1, hemoglobin 12.3, hematocrit 35.5, platelets 200.   Dr. Amil Amen felt at this time increased troponin  was secondary to the  rhythm disorder versus spasm/embolization.  The patient was then  discharged to home and Benicar was stopped and the patient would  continue Metoprolol.  Apparently the patient had already been on  Metoprolol at home.  The patient was discharged.   MEDICATIONS:  Includes home medications as listed above including  hydrochlorothiazide, Toprol-XL, Synthroid, Requip, Zocor, calcitriol,  calcium.  The patient had been on aspirin but this had been stopped  because she was supposed to have some type of arthroscopy in the near  future.  These medications are in general what is found in the chart.  The patient's discharge instruction sheet had not been completely filled  out although the patient was instructed upon medication usages.   The patient is to followup with Dr. Amil Amen  on July 20, 2007 at 9 a.m.  for blood work and then to see him at 9:30 a.m.   Wound care was given by providing the patient with an [employer] booklet.      Guy Franco, P.A.      Francisca December, M.D.  Electronically Signed    LB/MEDQ  D:  09/11/2007  T:  09/11/2007  Job:  [id number]

## 2011-05-14 NOTE — H&P (Signed)
[name], [name]              ACCOUNT NO.:  [id number]   MEDICAL RECORD NO.:  [id number]          PATIENT TYPE:  INP   LOCATION:  NA                           FACILITY:  [facility]   PHYSICIAN:  Ollen Gross, M.D.    DATE OF BIRTH:  1935/02/05   DATE OF ADMISSION:  DATE OF DISCHARGE:                                HISTORY & PHYSICAL   The patient is being admitted to the hospital tomorrow.   DATE OF OFFICE VISIT/HISTORY AND PHYSICAL:  March 30, 2005.   DATE OF ADMISSION:  April 05, 2005.   CHIEF COMPLAINT:  Right hip pain.   HISTORY OF PRESENT ILLNESS:  The patient is a 76 year old female who has  been seen by Dr. Lequita Halt for ongoing right hip pain.  Her last set of x-rays  earlier this year showed that she had end-stage arthritis in the right hip.  Pain is in the groin going down to the medial thigh toward the knee.  She is  at a stage now where it is affecting what she can and cannot do.  She has  previously undergone a left total hip replacement that has been successful  and very pleased with it, now she has reached the point where she would like  to have something done about the right hip.  The risks and benefits  discussed.  The patient admitted to the hospital for surgical intervention.   ALLERGIES:  No known drug allergies.   CURRENT MEDICATIONS:  1.  Synthroid 100 mcg.  2.  Vytorin 10/10.  3.  Premarin cream 0.5 three times a week.  4.  Calcitriol 0.25 mcg.  5.  Tenoretic 20/12.5 mg.  6.  Temazepam 30 mg as needed.  7.  Calcium 1800 mg.  8.  Aspirin 81 mg.  9.  Aleve twice a day.   PAST MEDICAL HISTORY:  1.  Hypertension.  2.  History of urinary tract infection.  3.  Hypothyroidism.  4.  Uterine fibroids.   PAST SURGICAL HISTORY:  1.  Thyroidectomy, (approximately 90%) in 1969.  2.  Removal of a benign uterine cyst in 2003.  3.  Several dilatation and curettage surgeries.  4.  Left total hip replacement in January of 2004 per Dr. Lequita Halt.   FAMILY  HISTORY:  Significant for lung cancer with a sister.   SOCIAL HISTORY:  Married, retired, nonsmoker.  No alcohol.  Has one child.  Her husband will be assisting with care after surgery.   REVIEW OF SYSTEMS:  GENERAL:  No fevers, chills, night sweats.  NEUROLOGICAL:  No seizure, syncope or paralysis.  RESPIRATORY:  No shortness  of breath, productive cough or hemoptysis.  CARDIOVASCULAR:  She did have a  dizzy episode about two weeks ago.  No chest pain, angina or orthopnea.  GASTROINTESTINAL:  A little bit of indigestion associated with dizziness two  weeks ago.  No nausea and vomiting, diarrhea or constipation.  No blood or  mucus in the stool.  GENITOURINARY:  No dysuria, hematuria or discharge.  MUSCULOSKELETAL:  Right hip:  Found in history of present illness.   PHYSICAL  EXAMINATION:  VITAL SIGNS:  Pulse 64, respirations 12, blood  pressure 138/88.  GENERAL:  A 76 year old white female, well-nourished, well-developed, in no  acute distress.  She is alert and oriented and cooperative and pleasant,  appears to be a good historian.  HEENT:  Normocephalic, atraumatic.  Pupils equal, round and reactive.  Oropharynx clear.  EOM's intact.  NECK:  Supple.  CHEST:  Clear anterior to posterior chest wall.  There is no rhonchi, rubs  or wheezing.  HEART:  A regular rate and rhythm.  No murmurs.  ABDOMEN:  Soft and nontender.  Bowel sounds present.  RECTAL/BREASTS/GENITALIA:  Not done, not pertinent to present illness.  EXTREMITIES:  Right hip:  The right hip shows flexion to only about 90  degrees.  There is no internal rotation, only about 5 degrees of external  rotation, only about 5 degrees of abduction.  She does have pain with  passive range of motion.   IMPRESSION:  1.  Osteoarthritis, right hip.  2.  Hypertension.  3.  History of urinary tract infection.  4.  Hypothyroidism.  5.  Uterine fibroids.   PLAN:  The patient will be admitted to [facility] to undergo a   right total hip replacement arthroplasty.  Surgery will be performed by Dr.  Ollen Gross.      ALP/MEDQ  D:  04/04/2005  T:  04/04/2005  Job:  [id number]   cc:   Jeannett Senior A. Evlyn Kanner, M.D.  [address]  [place]  [place] [address]  Fax: [phone]

## 2011-05-14 NOTE — Discharge Summary (Signed)
[name], [name]                        ACCOUNT NO.:  [id number]   MEDICAL RECORD NO.:  [id number]                   PATIENT TYPE:  INP   LOCATION:  0465                                 FACILITY:  [facility]   PHYSICIAN:  [name], M.D.                 DATE OF BIRTH:  Mar 24, 1935   DATE OF ADMISSION:  01/14/2003  DATE OF DISCHARGE:  01/18/2003                                 DISCHARGE SUMMARY   ADMISSION DIAGNOSES:  1. Osteoarthritis of the left hip.  2. Hypertension.  3. Hypercholesterolemia.  4. Hypothyroidism.  5. Osteopenia.  6. Postmenopausal.  7. History of uterine fibroids.   DISCHARGE DIAGNOSES:  1. Osteoarthritis of the left hip, status post left total hip replacement     arthroplasty.  2. Postoperative blood loss anemia.  3. Status post transfusion without sequela.  4. Postoperative hyponatremia, improved.  5. Hypertension.  6. Hypercholesterolemia.  7. Osteopenia.  8. Postmenopausal.  9. History of uterine fibroids.   PROCEDURE:  The patient was taken to the OR on 01/14/03, underwent a left  total hip replacement arthroplasty.  Surgeon was Dr. Ollen Lopez,  assistant was [name], P.A.-C.  Surgery was under general anesthesia.  Estimated blood loss was 500 cc.  Hemovac drain x1.  Complications were a  small calcar fracture that did not extend below the lesser trochanter.   CONSULTATIONS:  None.   HISTORY OF PRESENT ILLNESS:  The patient is a 76 year old female who is  referred to the care of Dr. Lequita Lopez by way of Dr. Noel Lopez for evaluation of  left hip pain.  The left leg has been bothering her for over a year now, the  past six months being quite severe.  Pain is located in the left groin and  left thigh.  She was seen in the office where x-rays show end-stage  osteoarthritis with bone-on-bone changes with about 25% subluxation of the  femoral head out of the socket.  With this significant finding on x-rays and  decreased mobility, it is felt she  would be appropriate for a left hip  replacement.  Risks and benefits of this procedure were discussed with the  patient, and she has elected to proceed with surgery.   LABORATORY DATA:  CBC on admission showed a hemoglobin of 11.7, hematocrit  34.2, white blood cell count 7.4, red blood cell count 3.63.  Differential  within normal limits.  Postoperative hemoglobin and hematocrit down to 9.3  and 26.8.  The patient was given 2 units of blood.  Post-transfusion  hemoglobin was 11 and hematocrit was 31.5.  Last noted hemoglobin and  hematocrit were 10.8 and 30.9.  PT and PTT on admission were 12.6 and 31,  respectively, with an INR of 0.9.  Serial prothrombin times followed.  Last  noted PT/INR were 22.8 and 2.3 while on Coumadin.  Chem panel on admission  was all within normal limits.  Followup BMET showed a drop in sodium from  138 down to 134, back up to 137, glucose went up from 82 to 139, back down  to 102.  Calcium from 9.4 down to 7, but back up to 8.  Urinalysis on  admission was negative.  Blood group type O negative.   Preoperative chest x-ray showed no active disease.  Preoperative pelvis and  hip films showed bilateral osteoarthritis, left greater then right.  Pelvis  films and hip films postoperatively on 01/14/03, showed left total hip  prosthesis with the cerclage wire approximated around the femoral component  of the prosthesis, no definite complicating features identified.  Single  view pelvis films shows left total hip prosthesis placement without evidence  of complicating feature.   HOSPITAL COURSE:  The patient was admitted to [facility], taken to  the OR, underwent the above stated procedure without complications.  The  patient tolerated the procedure well, was later transferred to the recovery  room and then to the orthopaedic floor for continued postoperative care.  Vital signs were followed.  The patient did have a drop in her hemoglobin  postoperatively  down to 9.3.  It was recommended that she receive 2 units of  blood.  The patient tolerated the transfusion quite well.  The patient was  also placed on PCA analgesia for pain control following surgery.  She was  given 24 hours of postoperative antibiotics and placed on Coumadin per deep  venous thrombosis prophylaxis.  PCA was discontinued by postoperative day  #2, and she was weaned over to p.o. medications.  Her blood count responded  very well, and her hemoglobin was back up to 11 after the transfusion.  She  tolerated blood quite well, and she did note that her foot and toes on the  left leg started to rotate inward.  This was felt due to weak external  rotators which were taken down at the time of surgery and repaired.  She did  start to develop some itching by postoperative day #2 and #3, which was felt  to be caused by the Percocet.  She was switched over to Vicodin.  Physical  therapy and occupational therapy were consulted postoperatively to assist  with gait training, ambulating, and activities of daily living.  The patient  was up ambulating approximately greater then 50 feet by postoperative day  #2, and then up to 80 feet by postoperative day #3.  She did well with the  physical therapy.  Dressing changes were initiated on postoperative day #2.  Incision was healing well, no signs of infection.  As she continued to  progress well with her physical therapy, arrangements were made for the  patient to go home.  She continued to progress up until postoperative day  #4, 01/18/03.  She is doing quite well.  Home arrangements had been made, and  it was decided that the patient could be discharged home.   DISCHARGE PLAN:  The patient is discharged home on 01/18/03.   DISCHARGE DIAGNOSES:  Please see above.   DISCHARGE MEDICATIONS:  1. Vicodin for pain.  2. Robaxin for spasm.  3. Coumadin as per pharmacy protocol.   DIET:  Low sodium, low cholesterol diet.  ACTIVITY:  Touchdown  weightbearing, hip precautions at all times.  Home  physical therapy and home health nursing through [facility].   DISPOSITION:  Home.    FOLLOWUP:  In 1-1/2 weeks, call the office for an appointment at [phone].  CONDITION ON DISCHARGE:  Improved.     Alexzandrew L. Julien Girt, P.A.              [name], M.D.    ALP/MEDQ  D:  02/19/2003  T:  02/19/2003  Job:  [id number]

## 2011-05-14 NOTE — Op Note (Signed)
[name], [name]              ACCOUNT NO.:  [id number]   MEDICAL RECORD NO.:  [id number]          PATIENT TYPE:  INP   LOCATION:  0006                         FACILITY:  [facility]   PHYSICIAN:  Ollen Gross, M.D.    DATE OF BIRTH:  [birth year]   DATE OF PROCEDURE:  04/05/2005  DATE OF DISCHARGE:                                 OPERATIVE REPORT   PREOPERATIVE DIAGNOSIS:  Osteoarthritis, right hip.   POSTOPERATIVE DIAGNOSIS:  Osteoarthritis, right hip.   PROCEDURE:  Right total hip arthroplasty.   SURGEON:  Ollen Gross, M.D.   ASSISTANT:  Avel Peace, PA-C.   ANESTHESIA:  General.   ESTIMATED BLOOD LOSS:  300.   DRAINS:  Hemovac x1.   COMPLICATIONS:  None.   CONDITION:  Stable to the recovery room.   CLINICAL NOTE:  [name] is a 76 year old female with severe end-stage  osteoarthritis of the right hip with intractable pain.  She has had a  previous successful left total hip arthroplasty and presents now for right  total hip arthroplasty.   PROCEDURE IN DETAIL:  After successful administration of spinal anesthetic,  the patient was placed in the left lateral decubitus position with the right  side up and held with a hip positioner.  The right lower extremity was  isolated from her perineum with plastic drapes and prepped and draped in the  usual sterile fashion.  The short posterolateral incision was then made with  a 10 blade through the subcutaneous tissue to the fascia lata, which was  incised in line with the skin incision.  The sciatic nerve was palpated and  protected.  Short rotators were isolated off the femur.  A capsulectomy was  performed, and the hip was dislocated.  The center of the femoral head is  marked, and a trial prosthesis placed such that the center of the trial head  corresponds to the center of the native femoral head.  Osteotomy line is  marked on the femoral neck, and an osteotomy is made with an oscillating  saw.  The femoral head is  subsequently removed.  The femur is then retracted  anteriorly to gain acetabular exposure.   Acetabular osteophytes and labrum are removed.  Reaming starts at 45 mm,  coursing the increments of 2 up to 52 and then a 52 mm Pinnacle acetabular  shell is placed in anatomic position and transfixed with two dome screws.  A  trial 28 mm neutral liner is placed.   The femur is then addressed with the canal finder and irrigation.  Axial  reaming is performed up to 11.5 mm, proximal reaming to a 16D, and the  sleeve machine to a small.  A 16D small trial sleeve is placed with a 16 x  11 stem and a 36+6 neck.  We matched her native anteversion.  A 28+0 head is  placed.  The hip is reduced with outstanding stability with full extension  and full external rotation to 70 degrees flexion, 40 degrees adduction, 90  degrees internal rotation, 90 degrees flexion, 90 degrees internal rotation.  Placing the right leg on  top of the left, the leg lengths are found to be  equal.  The hip is then dislocated, and all trials are removed.  The  permanent apex hole eliminator is placed into the acetabular shell, then a  permanent 28 mm neutral Marathon liner is placed into the shell.  The  permanent 16B small sleeve is placed with a 16 x 11 stem, 36+6 neck,  matching her native anteversion.  A 28+0 head is placed, and the hip is  reduced with the same-stability parameters.  The wound is copiously  irrigated with saline solution, and the short rotators are reattached at the  femur through drill holes.  The fascia lata is closed over Hemovac drain  with interrupted #1 Vicryl.  The subcu is closed with #1 and then 2-0  Vicryl.  The subcuticular is closed with running 4-0 Monocryl.  The incision  is cleaned and dried.  The drain is hooked to suction.  Prior to placing the  bandage on, I injected the subcu tissues with 20 cc of 0.25% plain Marcaine.  Once the bandage and bulky sterile dressing is on, then she was  placed into  a knee immobilizer, awakened, and transported to the recovery room in stable  condition.      FA/MEDQ  D:  04/05/2005  T:  04/05/2005  Job:  [id number]

## 2011-05-14 NOTE — H&P (Signed)
[name], [name]                        ACCOUNT NO.:  [id number]   MEDICAL RECORD NO.:  [id number]                   PATIENT TYPE:  INP   LOCATION:  0465                                 FACILITY:  [facility]   PHYSICIAN:  Ollen Gross, M.D.                 DATE OF BIRTH:  06-29-1935   DATE OF ADMISSION:  01/14/2003  DATE OF DISCHARGE:                                HISTORY & PHYSICAL   CHIEF COMPLAINT:  Left hip pain.   HISTORY OF PRESENT ILLNESS:  The patient is a 76 year old female who is  referred over to the care of Dr. Lequita Halt by way of Dr. Noel Gerold for evaluation  of left hip pain.  The left leg has been bothering her for over a year now,  with the past six months being quite severe.  Most of the pain is located in  the left groin and the left thigh.  It has reached the point where it is  hurting her all the time, during the day and night.  Activity seems to make  it worse.  She denies any accident or injury leading to her pain.  She is  seen in the office where x-rays show left hip with end-stage osteoarthritis  and bone-on-bone changes with about 25% subluxation of the femoral head out  of the socket.  With the significant finding on x-rays and her decrease in  mobility, it is felt she would be an appropriate candidate for a left total  knee replacement arthroplasty.  Risks and benefits of the procedure have  been discussed with the patient, and she has elected to proceed with  surgery.   ALLERGIES:  No known drug allergies.   MEDICATIONS:  She does not have a complete list with [place].  The medications  that are listed are:  1. Altace.  2. Synthroid,  3. Aleve.  4. Tylenol.  5. Zetia.  6. Calcitrol.  7. Premarin.  She will bring these medications and dosages to the hospital for appropriate  treatment postoperatively.   PAST MEDICAL HISTORY:  1. Hypertension.  2. Hypercholesterolemia.  3. Hypothyroidism.  4. Osteopenia.  5. Post-menopausal.  6. History of uterine  fibroids.   PAST SURGICAL HISTORY:  1. Thyroid surgery in 1969.  2. D&C and removal of a benign fibroid in 2001.  She also had a D&C prior to     the previous D&C in 2001.   SOCIAL HISTORY:  She is married, retired, nonsmoker, no alcohol.  One child.  Husband will be assisting her with care after surgery.  She lives in a one  story home, no steps entering.   FAMILY HISTORY:  Father deceased at age 74, with a history of stroke and  hypertension, and arthritis.  Mother with a history of arthritis and  hypertension.  She has a sister deceased at age 69, with lung cancer.  She  had brothers the age  of 62 and 57, with hypertension.  A sister age 54, with  hypertension.   REVIEW OF SYMPTOMS:  GENERAL:  No fevers, chills, night sweats.  NEUROLOGIC:  No seizures, syncope, or paralysis.  RESPIRATORY:  No shortness of breath,  productive cough, or hemoptysis.  CARDIOVASCULAR:  No chest pain, angina, or  orthopnea.  GASTROINTESTINAL:  No nausea, diarrhea, vomiting, constipation,  no blood or mucus in the stool.  GENITOURINARY:  No dysuria, hematuria, or  discharge.  MUSCULOSKELETAL:  Pertinent to the left hip found in the history  of present illness.   PHYSICAL EXAMINATION:  VITAL SIGNS:  Pulse 64, respirations 12, blood  pressure 146/82.  GENERAL:  The patient is a 76 year old female, well-developed, well-  nourished, thin framed.  She is alert, oriented, cooperative, and very  pleasant at time of examination.  HEENT:  Normocephalic, atraumatic.  Pupils are round and reactive.  The  patient is noted to wear glasses.  Oropharynx is clear.  NECK:  Supple.  No carotid bruits are appreciated.  CHEST:  Clear to auscultation anterior and posterior chest walls.  HEART:  Regular rhythm.  Normal sinus rhythm.  S1 with a split S2.  No  murmurs.  ABDOMEN:  Soft, nontender, bowel sounds present.  RECTAL:  Not done, not pertinent to present illness.  BREASTS:  Not done, not pertinent to present  illness.  GENITALIA:  Not done, not pertinent to present illness.  EXTREMITIES:  Significant to that of the left lower extremity.  Left hip  shows flexion of only 80 degrees, no internal rotation, and no external  rotation.  There is only 10 degrees of abduction.  The left lower extremity  is approximately 1/4 inch shorter as compared to the right.   IMPRESSION:  1. Osteoarthritis, left hip.  2. Hypertension.  3. Hypercholesterolemia.  4. Hypothyroidism.  5. Osteopenia.  6. Postmenopausal.  7. History of uterine fibroids.    PLAN:  The patient is admitted to [facility] to undergo a left  total hip replacement arthroplasty.  The patient's medical physician is Dr.  Evlyn Kanner.  Dr. Evlyn Kanner will be notified of the patient's room number on  admission, and be consulted if needed for any medical assistance with this  patient throughout the hospital course.     Alexzandrew L. Julien Girt, P.A.              Ollen Gross, M.D.    ALP/MEDQ  D:  01/15/2003  T:  01/16/2003  Job:  [id number]   cc:   Jeannett Senior A. Evlyn Kanner, M.D.  [address]  [place]  [place] [address]  Fax: [phone]

## 2011-05-14 NOTE — Op Note (Signed)
[name], [name]              ACCOUNT NO.:  [id number]   MEDICAL RECORD NO.:  [id number]          PATIENT TYPE:  AMB   LOCATION:  ENDO                         FACILITY:  [facility]   PHYSICIAN:  Anselmo Rod, M.D.  DATE OF BIRTH:  September 18, 1935   DATE OF PROCEDURE:  02/02/2006  DATE OF DISCHARGE:                                 OPERATIVE REPORT   PROCEDURE PERFORMED:  Colonoscopy with cone biopsies x6.   ENDOSCOPIST:  Anselmo Rod, M.D.   INSTRUMENT USED:  Olympus video colonoscope.   INDICATION FOR PROCEDURE:  [age]-year-old female undergoing screening  colonoscopy to rule out colonic polyps, masses, etc.   PREPROCEDURE PREPARATION:  Informed consent was procured from the patient  and the patient fasted for 4 hours prior to the procedure and prepped with  Osmoprep the night and the morning of the procedure.  Risks and benefits of  the procedure including a 10% missed rate of cancer in polyps were discussed  with the patient in detail.   PREPROCEDURE PHYSICAL:  VITAL SIGNS:  The patient had stable vital signs.  NECK:  Supple.  CHEST:  Clear to auscultation.  CARDIAC:  S1 and S2, regular.  ABDOMEN:  Soft with normal bowel sounds.   DESCRIPTION OF THE PROCEDURE:  The patient was placed in the left lateral  decubitus position and sedated with 100 mcg of fentanyl and 7.5 mg of Versed  in slow incremental doses.  Once the patient was adequately sedated and  maintained on low-flow oxygen and continuous cardiac monitoring, the Olympus  video colonoscope was advanced from the rectum to the cecum.  There were  scattered diverticula seen throughout the colon with no prominent changes in  the left colon.  Old heme was noted in the rectum and as there was some loss  of vascular markings in this area, biopsies were done from the anal verge to  rule out proctitis.  A small polyp was biopsied from the appendicular  orifice to rule out carcinoid syndrome.  The rest of the exam was  unremarkable.  Retroflexion in the rectum revealed no abnormalities.  The  terminal ileum was normal.   IMPRESSION:  1.  Old heme seen in rectum with loss of vascular pattern, biopsies done to      rule out proctitis.  2.  Scattered diverticulosis with no prominent changes in the left colon.  3.  Small polyp biopsies from the appendicular orifice.  4.  Normal terminal ileum.   RECOMMENDATIONS:  1.  Await pathology results.  2.  Continue on high-fiber diet and liberal fluid intake.  3.  Avoid all nonsteroidals for now.  4.  Further recommendations will be made after pathology results have been      procured.  5.  Brochure on diverticulosis along with a high-fiber diet have been given      to the patient for education.      Anselmo Rod, M.D.  Electronically Signed     JNM/MEDQ  D:  02/02/2006  T:  02/03/2006  Job:  [id number]   cc:   Edwena Felty. Romine, M.D.  Fax: [phone]   Tera Mater. Evlyn Kanner, M.D.  Fax: [phone]

## 2011-05-14 NOTE — Op Note (Signed)
NAME:  [name], [name]                        ACCOUNT NO.:  [id number]   MEDICAL RECORD NO.:  [id number]                   PATIENT TYPE:  INP   LOCATION:  X003                                 FACILITY:  [facility]   PHYSICIAN:  Ollen Gross, M.D.                 DATE OF BIRTH:  [birth year]   DATE OF PROCEDURE:  01/14/2003  DATE OF DISCHARGE:                                 OPERATIVE REPORT   PREOPERATIVE DIAGNOSES:  Osteoarthritis, left hip.   POSTOPERATIVE DIAGNOSES:  Osteoarthritis, left hip.   PROCEDURE:  Left total hip arthroplasty.   SURGEON:  Ollen Gross, M.D.   ASSISTANT:  Avel Peace, P.A.-C.   ANESTHESIA:  General.   ESTIMATED BLOOD LOSS:  500   DRAINS:  Hemovac x1.   COMPLICATIONS:  Small calcar fracture that did not extend below the lesser  trochanter.   CONDITION:  Stable to recovery.   BRIEF CLINICAL NOTE:  Ms. Bonk is a 76 year old female with severe end-  stage osteoarthritis of the left hip with pain refractory to nonoperative  management. She presents now for left total hip arthroplasty.   DESCRIPTION OF PROCEDURE:  After successful administration of general  anesthetic, the patient was placed in the right lateral decubitus position  with the left side up and held with the hip positioner. The left lower  extremity was isolated from her perineum with plastic drapes and prepped and  draped in the usual sterile fashion. A posterolateral incision was made with  a 10 blade through the subcutaneous tissue to the level of the fascia lata  which was incised with the skin incision. The sciatic nerve was palpated and  protected and short rotators isolated off the femur. Capsulectomy was  performed and the hip dislocated. The center of the femoral head was marked  and trial prosthesis placed such that the center of the trial head  corresponds to the center of her native femoral head. The osteotomy line was  marked and osteotomy made with an oscillating saw. The  femoral head was  removed the femur retracted anteriorly and acetabular exposure obtained.   The labrum was removed and acetabular reaming initiated with a 45 coursing  in increments of 2 to 51 and a 52 mm pinnacle acetabular shell was placed in  anatomic position and then transfixed with two dome screws with excellent  purchase. The cup impactor was then placed back into the cup and upon  attempting to remove the cup, the entire pelvis was moving as one unit thus  we had a stable construct. The 28 mm neutral trial liner was then placed.   The femur was prepared first proximally with the canal finder and  irrigation. We reamed axially up to 11.5 mm, proximally up to a 16D and  machined the sleeve for a small. The 16D small trial sleeve is placed and a  16 x 11 stem with a 36 plus  6 neck and a 28 plus zero head. The hip is  reduced with excellent stability, full extension, full external rotation, 70  degrees flexion, 40 degrees adduction, 90 degrees internal rotation, 90  degrees flexion, 70 degrees internal rotation, and she flexed over 100  degrees. The trials are then removed and permanent apex hole eliminator and  permanent 28 mm neutral marathon liner placed. The 28D small sleeve was then  placed and it is noted there is a small crack in the calcar anteriorly. We  then removed the sleeve to see that the crack went down to approximately the  level of the lesser trochanter but not beyond. I decided to prophylactically  place a Dall-Miles cable. We first reimpacted the sleeve and placed the 16 x  11 stem with the 36 plus 6 neck matching her native anteversion. The 28 plus  zero head is placed, hip reduced with the same stability parameters. We then  elevated some of the vastus lateralis off the femur and placed a Dall-Miles  cable just inferior to the lesser trochanter and I did not note the fracture  going all the way down to this site. The cable was placed prophylactic to  prevent the  line from spreading. The cable was tightened and the connector  crimped and cable cut. We then reattached the short external rotators to the  femur through drill holes and fascia lata was closed over a Hemovac drain  with interrupted #1 Vicryl, subcu closed with #1 and 2-0 Vicryl and  subcuticular with running 4-0 Monocryl. The incision was clean and dry and  Steri-Strips and bulky sterile dressing applied. She was subsequently  awakened and transported to recovery in stable condition.                                               Ollen Gross, M.D.    FA/MEDQ  D:  01/14/2003  T:  01/14/2003  Job:  [id number]

## 2011-05-14 NOTE — Discharge Summary (Signed)
NAMEJEMA, DEEGAN              ACCOUNT NO.:  [id number]   MEDICAL RECORD NO.:  [id number]          PATIENT TYPE:  INP   LOCATION:  0456                         FACILITY:  [facility]   PHYSICIAN:  Ollen Gross, M.D.    DATE OF BIRTH:  [birth year]   DATE OF ADMISSION:  04/05/2005  DATE OF DISCHARGE:  04/08/2005                                 DISCHARGE SUMMARY   ADMITTING DIAGNOSES:  1. Osteoarthritis right hip.  2. Hypertension.  3. History of urinary tract infection.  4. Hypothyroidism.  5. Uterine fibroids.     DISCHARGE DIAGNOSES:  1. Osteoarthritis right hip status post right total hip arthroplasty.  2. Mild postoperative blood loss anemia.  Did not require transfusion.  3. Mild postoperative hyponatremia.  4. Hypertension.  5. History of urinary tract infection.  6. Hypothyroidism.  7. Uterine fibroids.     PROCEDURE:  April 05, 2005 right total hip arthroplasty.  Surgeon Dr. Homero Fellers  Aluisio.  Assistant Avel Peace, P.A.-C.  Anesthesia general.  Blood loss  300 mL.   CONSULTS:  None.   BRIEF HISTORY:  Ms. Langstaff is a 76 year old female with severe end-stage  arthritis of right hip with intractable pain.  Previous successful left  total hip.  Now presents for right total hip.   LABORATORY DATA:  Preoperative CBC:  Hemoglobin 13, hematocrit 38.9.  Differential normal.  Postoperative hemoglobin 10.8.  Last noted H&H 10.6  and 32.  Preoperative PTT and PT 12.4 and 30.  Serial pro times followed.  Last noted PT/INR 60.3/1.5.  Chem panel on admission:  Elevated BUN of 25,  low albumin of 3.4.  Remaining chem panel within normal limits.  Serial  BMETs were followed.  Sodium did drop from 137 to 132.  Glucose went up from  93 to 129 back to 112.  Calcium dropped from 8.5 to 7.3.  Urinalysis  preoperative negative.  Blood group type O-.  Hip and pelvis film April 05, 2005:  Anatomic alignment status post right total hip.  No complicating  features.  EKG dated April 05, 2005:  Normal sinus rhythm, normal EKG.  No  significant change since last tracing of March 30, 2005.  Confirmed by Dr.  Othelia Pulling.  Preoperative EKG March 30, 2005:  Sinus bradycardia, otherwise  normal.  No changes since last tracing confirmed by Dr. Lady Deutscher.   HOSPITAL COURSE:  Admitted to [facility].  Underwent above stated  procedure with no known complications.  Patient tolerated procedure well.  Later returned to recovery room, then to orthopedic floor.  Postoperative  care.  Vital signs were followed.  On day one patient did have some pain,  was tolerating medications okay.  Hemovac drain pulled.  Started to get up  out of bed.  By day two patient was doing better, in better spirits, less  pain.  Dressing change.  Incision was healing well.  Was ambulating  approximately 80 and 100 feet with physical therapy, progressing well.  Hemoglobin was 9.6.  All lines were discontinued.  Was doing so well by day  three she was  already sitting up in the chair.  Did have a little bit of toe  pain that was felt to be a gouty attack.  Had a rough night, but wanted to  still go home.  Was ambulating approximately 100 feet with therapy,  progressing well, and was allowed home.   DISCHARGE MEDICATIONS/PLAN:  Patient discharged home on April 09, 2005.   DISCHARGE DIAGNOSES:  Please see above.   DISCHARGE MEDICATIONS:  Coumadin, Percocet, Robaxin, colchicine.   DIET:  Resume previous home diet.  Recommend low sodium diet.   FOLLOW-UP:  Two weeks from surgery.   ACTIVITY:  Partial weightbearing 25-50% right lower extremity.  Hip  precautions:  Total hip protocol.  May start showering.  Home health PT,  home health nursing.   DISPOSITION:  Home.   CONDITION ON DISCHARGE:  Improved.      ALP/MEDQ  D:  05/18/2005  T:  05/18/2005  Job:  [id number]   cc:   Jeannett Senior A. Evlyn Kanner, M.D.  [address]  [place]  [place] [address]  Fax: [phone]

## 2011-05-27 ENCOUNTER — Emergency Department (HOSPITAL_COMMUNITY): Payer: Medicare Other

## 2011-05-27 ENCOUNTER — Ambulatory Visit (HOSPITAL_COMMUNITY)
Admission: EM | Admit: 2011-05-27 | Discharge: 2011-05-28 | Disposition: A | Discharge status: Home / Self Care | Payer: Medicare Other | Source: Ambulatory Visit | Attending: Orthopedic Surgery | Admitting: Orthopedic Surgery

## 2011-05-27 DIAGNOSIS — T84029A Dislocation of unspecified internal joint prosthesis, initial encounter: Secondary | ICD-10-CM | POA: Insufficient documentation

## 2011-05-27 DIAGNOSIS — X58XXXA Exposure to other specified factors, initial encounter: Secondary | ICD-10-CM | POA: Insufficient documentation

## 2011-05-27 DIAGNOSIS — Y92009 Unspecified place in unspecified non-institutional (private) residence as the place of occurrence of the external cause: Secondary | ICD-10-CM | POA: Insufficient documentation

## 2011-05-28 ENCOUNTER — Inpatient Hospital Stay (HOSPITAL_COMMUNITY): Payer: Medicare Other

## 2011-05-30 NOTE — Op Note (Signed)
  [name], [name]              ACCOUNT NO.:  [id number]  MEDICAL RECORD NO.:  [id number]           PATIENT TYPE:  I  LOCATION:  3013                         FACILITY:  [facility]  PHYSICIAN:  Madlyn Frankel. Charlann Boxer, M.D.  DATE OF BIRTH:  1935-06-26  DATE OF PROCEDURE:  05/28/2011 DATE OF DISCHARGE:                              OPERATIVE REPORT   PREOPERATIVE DIAGNOSIS:  Dislocation, left total hip replacement.  POSTOPERATIVE DIAGNOSIS:  Dislocation, left total hip replacement.  PROCEDURE:  Closed reduction of left total hip replacement under general anesthetic.  SURGEON:  Madlyn Frankel. Charlann Boxer, MD  ASSISTANT:  Surgical team.  ANESTHESIA:  General.  SPECIMENS:  None.  COMPLICATIONS:  None apparent.  INDICATIONS FOR PROCEDURE:  [name] is a 76 year old female with history of left total hip replacement performed in 2004.  She had been in normal state of health without previous dislocation of her hip.  She bent forward then she sought in anytime before felt a large pop and inability to bear weight and lot of pain.  She was brought to the emergency room.  After failed attempts in the emergency room at closed reduction under conscious sedation, she was brought to the operating room for management.  Risks and benefits have been discussed. Postoperative course reviewed.  Consent was obtained for above.  PROCEDURE IN DETAIL:  The patient was brought to the operative theater. Once anesthesia was established, time-out performed identifying the patient, planned procedure, and extremity.  She was positioned on her stretcher.  Surgical assistant applying pressure on the iliac crest bilaterally.  With the aid of initially propofol followed by the use of some paralytics traction and internal rotation was applied and the hip reduced.  The reduction was palpable and restorations of her leg lengths were identified.  Following this, she was placed in the immobilizer, allowed to awake  from her anesthesia, and then brought to the recovery room in a knee immobilizer.  We will then plan to see her.  Given the time of day, she will plan to be admitted and will go home later on today with knee immobilizer with plan to follow up Dr. Lequita Halt in 3-week period time.     Madlyn Frankel Charlann Boxer, M.D.     MDO/MEDQ  D:  05/28/2011  T:  05/28/2011  Job:  [id number]  Electronically Signed by Durene Romans M.D. on 05/30/2011 04:26:11 PM

## 2011-05-30 NOTE — H&P (Signed)
[name], [name]              ACCOUNT NO.:  [id number]  MEDICAL RECORD NO.:  [id number]           PATIENT TYPE:  I  LOCATION:  3013                         FACILITY:  [facility]  PHYSICIAN:  Madlyn Frankel. Charlann Boxer, M.D.  DATE OF BIRTH:  1935-09-11  DATE OF ADMISSION:  05/27/2011 DATE OF DISCHARGE:                             HISTORY & PHYSICAL   ADMITTING DIAGNOSIS:  Dislocation, left total hip replacement.  ADMITTING HISTORY:  Ms. [name] is a 76 year old female with history of a left total hip replacement in January 2004.  She had been in normal state of health until today when she bent forward, she did not feel any more different than she what she had before, but felt a large pop, deformity, inability to bear weight, and a lot of pain.  She was brought to the emergency room where emergency room physicians evaluated her and called [place].  This was approximately midnight.  The emergency room physicians attempted a couple of different times under conscious sedation and were unsuccessful getting the hip reduced.  After further consultation, the plan was for her to come to the operating room for evaluation and treatment.  She had no other problems report, no numbness or tingling in left lower extremity.  Past medical history includes: 1. Hypertension. 2. History of urinary tract infections. 3. History of hypothyroidism. 4. History of uterine fibroids. 5. History of bilateral total hip replacement.  Past surgical includes: 1. Thyroidectomy. 2. History of bilateral total hip replacements; left in January 2004,     right April 2006.  FAMILY HISTORY:  Significant for lung cancer.  SOCIAL HISTORY:  She socially lives alone.  She denies any alcohol use.  Home medications include Synthroid, Vytorin, Premarin, calcitriol, Tenoretic, temazepam, calcium, aspirin, and Aleve.  DRUG ALLERGIES:  No known drug allergies.  PHYSICAL EXAMINATION:  GENERAL:  The patient was seen and evaluated  in the holding area of the operating room.  She was in obvious discomfort, but had received pain medicine as well as her sedation previously.  She was awake, alert, and oriented. EXTREMITIES:  Upper extremity examination was normal.  Examination of the left and right lower extremity revealed a shortened and internally rotated left lower extremity.  She is neurovascularly intact, distal pulses and intact sensibility to light touch, but pain with any movement.  Right lower extremity was otherwise neutrally positioned. CHEST:  Clear to auscultation bilaterally. HEART:  Regular rate and rhythm. ABDOMEN:  Soft, nontender.  Radiographs revealed a superior dislocation of left total hip was stable without evidence of any periprosthetic abnormality or fracture.  ASSESSMENT:  Left total hip replacement dislocation.  PLAN:  The patient to be taken to the operating room for closed reduction under anesthesia.  Given the time and date and socially that she lives alone, she will be admitted for 23 hours and sent home in the morning.  She will be placed in a knee immobilizer and will follow up with Dr. Sherlean Foot in about a 3-week timeframe.  Questions encouraged, answered, reviewed.     Madlyn Frankel Charlann Boxer, M.D.     MDO/MEDQ  D:  05/28/2011  T:  05/28/2011  Job:  [id number]  Electronically Signed by Durene Romans M.D. on 05/30/2011 04:26:15 PM

## 2011-06-02 LAB — POCT I-STAT 4, (NA,K, GLUC, HGB,HCT)
Hemoglobin: 13.9 g/dL (ref 12.0–15.0)
Potassium: 3.2 mEq/L — ABNORMAL LOW (ref 3.5–5.1)
Sodium: 140 mEq/L (ref 135–145)

## 2011-10-07 LAB — URINALYSIS, ROUTINE W REFLEX MICROSCOPIC
Glucose, UA: NEGATIVE
Hgb urine dipstick: NEGATIVE
Ketones, ur: NEGATIVE
Protein, ur: NEGATIVE
pH: 7.5

## 2011-10-07 LAB — TYPE AND SCREEN
ABO/RH(D): O NEG
Antibody Screen: NEGATIVE

## 2011-10-07 LAB — COMPREHENSIVE METABOLIC PANEL
ALT: 16
AST: 22
Alkaline Phosphatase: 66
CO2: 31
Chloride: 101
GFR calc Af Amer: 57 — ABNORMAL LOW
GFR calc non Af Amer: 47 — ABNORMAL LOW
Glucose, Bld: 79
Potassium: 3.4 — ABNORMAL LOW
Sodium: 140

## 2011-10-07 LAB — CBC
Hemoglobin: 14.9
MCHC: 33.6
RBC: 4.71
WBC: 5.9

## 2011-10-07 LAB — PROTIME-INR: Prothrombin Time: 13.1

## 2011-10-12 LAB — CK TOTAL AND CKMB (NOT AT ARMC)
CK, MB: 5.3 — ABNORMAL HIGH
CK, MB: 6 — ABNORMAL HIGH
CK, MB: 8.4 — ABNORMAL HIGH
Relative Index: 4.2 — ABNORMAL HIGH
Relative Index: 6.4 — ABNORMAL HIGH
Total CK: 125
Total CK: 132

## 2011-10-12 LAB — COMPREHENSIVE METABOLIC PANEL
Alkaline Phosphatase: 60
BUN: 26 — ABNORMAL HIGH
Chloride: 101
Creatinine, Ser: 1.19
Glucose, Bld: 130 — ABNORMAL HIGH
Potassium: 3.6
Total Bilirubin: 1

## 2011-10-12 LAB — CBC
HCT: 40.8
Hemoglobin: 12.4
Hemoglobin: 13
Hemoglobin: 14.2
MCHC: 34.5
MCHC: 34.6
MCV: 90.4
MCV: 90.8
MCV: 91.6
Platelets: 200
Platelets: 267
RBC: 3.87
RBC: 3.94
RBC: 4.06
RDW: 13.6
WBC: 11.7 — ABNORMAL HIGH
WBC: 5.1
WBC: 8.1

## 2011-10-12 LAB — POCT CARDIAC MARKERS
CKMB, poc: 7.2
Operator id: 1627
Troponin i, poc: 0.31 — ABNORMAL HIGH

## 2011-10-12 LAB — PROTIME-INR
INR: 1
Prothrombin Time: 13.4

## 2011-10-12 LAB — DIFFERENTIAL
Basophils Absolute: 0.1
Basophils Relative: 1
Lymphocytes Relative: 3 — ABNORMAL LOW
Neutro Abs: 10.8 — ABNORMAL HIGH
Neutrophils Relative %: 93 — ABNORMAL HIGH

## 2011-10-12 LAB — LIPASE, BLOOD: Lipase: 35

## 2011-10-12 LAB — HEPARIN LEVEL (UNFRACTIONATED)
Heparin Unfractionated: 0.55
Heparin Unfractionated: 1.07 — ABNORMAL HIGH

## 2011-10-12 LAB — APTT: aPTT: 31

## 2012-01-28 HISTORY — PX: OTHER SURGICAL HISTORY: SHX169

## 2012-04-08 IMAGING — CR DG SPINE 1V PORT
1 series · 1 of 1 positions shown · non-contrast
Comparison: 02/17/2011 at 5555 hours.

CLINICAL DATA: Intraoperative localization.

LUMBAR SPINE - 1 VIEW

[lateral]
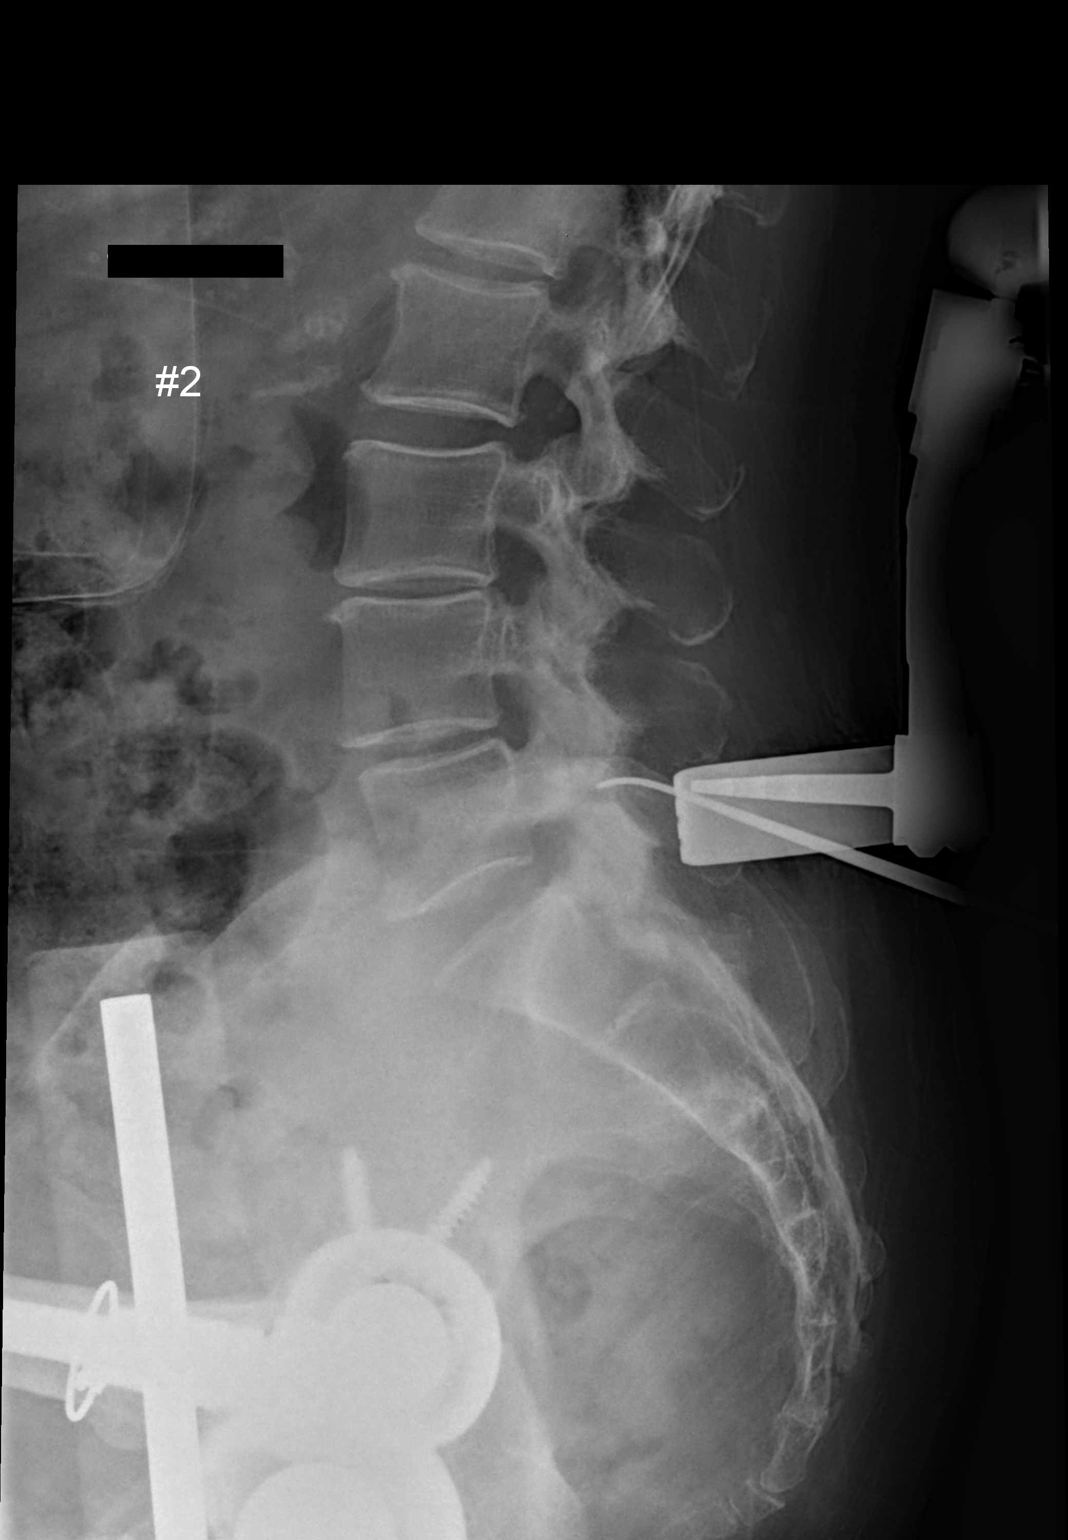

[1 of 1 positions shown; findings below may reference images not displayed]

FINDINGS: Surgical instrument tip projects posterior to the L5
pedicles.  Facet hypertrophy is seen in the mid and lower lumbar
spine.  Endplate degenerative changes are seen throughout.  Loss of
disc space height at L3-4 and L4-5.
IMPRESSION: Intraoperative localization, as above.

## 2012-05-15 ENCOUNTER — Encounter: Payer: Self-pay | Admitting: Internal Medicine

## 2012-06-20 ENCOUNTER — Encounter: Payer: Self-pay | Admitting: Internal Medicine

## 2012-06-20 ENCOUNTER — Ambulatory Visit (AMBULATORY_SURGERY_CENTER): Payer: Medicare Other

## 2012-06-20 VITALS — Ht 63.5 in | Wt 158.1 lb

## 2012-06-20 DIAGNOSIS — Z1211 Encounter for screening for malignant neoplasm of colon: Secondary | ICD-10-CM

## 2012-06-20 DIAGNOSIS — Z86010 Personal history of colonic polyps: Secondary | ICD-10-CM

## 2012-06-20 MED ORDER — MOVIPREP 100 G PO SOLR: ORAL | Status: DC

## 2012-06-20 NOTE — Progress Notes (Signed)
Pt came in for her pre-visit prior to her colonoscopy with Dr Marina Goodell on 07/04/12 Pt states she had a colonoscopy done 7 years ago with  Dr Loreta Ave. A medical release form was filled out and given to [facility]. Ulis Rias RN

## 2012-06-23 ENCOUNTER — Telehealth: Payer: Self-pay | Admitting: Internal Medicine

## 2012-06-23 NOTE — Telephone Encounter (Signed)
Forward to Dr. Aloha Gell for review on 06-23-12 ym

## 2012-06-23 NOTE — Telephone Encounter (Deleted)
Forward to Dr. Charna Elizabeth for review on 06-23-12 ym

## 2012-06-23 NOTE — Telephone Encounter (Signed)
Forward to Dr. P. John for review on 06-23-12 ym 

## 2012-07-04 ENCOUNTER — Encounter: Payer: Self-pay | Admitting: Internal Medicine

## 2012-07-04 ENCOUNTER — Ambulatory Visit (AMBULATORY_SURGERY_CENTER): Payer: Medicare Other | Admitting: Internal Medicine

## 2012-07-04 VITALS — BP 139/75 | HR 51 | Temp 97.5°F | Resp 16 | Ht 63.5 in | Wt 158.0 lb

## 2012-07-04 DIAGNOSIS — Z1211 Encounter for screening for malignant neoplasm of colon: Secondary | ICD-10-CM

## 2012-07-04 DIAGNOSIS — D126 Benign neoplasm of colon, unspecified: Secondary | ICD-10-CM

## 2012-07-04 DIAGNOSIS — Z86010 Personal history of colonic polyps: Secondary | ICD-10-CM

## 2012-07-04 MED ORDER — SODIUM CHLORIDE 0.9 % IV SOLN: 500.0000 mL | INTRAVENOUS | Status: DC

## 2012-07-04 NOTE — Progress Notes (Signed)
Patient did not experience any of the following events: a burn prior to discharge; a fall within the facility; wrong site/side/patient/procedure/implant event; or a hospital transfer or hospital admission upon discharge from the facility. (G8907) Patient did not have preoperative order for IV antibiotic SSI prophylaxis. (G8918)  

## 2012-07-04 NOTE — Op Note (Signed)
[facility] [address] [place], [place]  [address]  COLONOSCOPY PROCEDURE REPORT  PATIENT:  Rita, Lopez  MR#:  [id number] BIRTHDATE:  1935-09-16, 76 yrs. old  GENDER:  female ENDOSCOPIST:  Wilhemina Bonito. Eda Keys, MD REF. BY:  Adrian Prince, M.D. PROCEDURE DATE:  07/04/2012 PROCEDURE:  Colonoscopy with snare polypectomy x 2 ASA CLASS:  Class II INDICATIONS:  history of pre-cancerous (adenomatous) colon polyps, surveillance and high-risk screening ; index exam 01-2006 (Dr Loreta Ave) w/TVA cecum / appy orifice MEDICATIONS:   MAC sedation, administered by CRNA, propofol (Diprivan) 400 mg IV  DESCRIPTION OF PROCEDURE:   After the risks benefits and alternatives of the procedure were thoroughly explained, informed consent was obtained.  Digital rectal exam was performed and revealed no abnormalities.   The LB CF-[id number] endoscope was introduced through the anus and advanced to the cecum, which was identified by both the appendix and ileocecal valve, without limitations.  The quality of the prep was excellent, using MoviPrep.  The instrument was then slowly withdrawn as the colon was fully examined. <<PROCEDUREIMAGES>>  FINDINGS:  A 10mm pedunculated polyp was found at the appendiceal orifice (previous site of bx) and snared without cautery. Retrieval was successful.  A diminutive polyp was found in the descending colon and snared without cautery. Retrieval was successful.  Moderate diverticulosis was found throughout the colon.   Retroflexed views in the rectum revealed no abnormalities.  The time to cecum =  3:05  minutes. The scope was then withdrawn in  13:58  minutes from the cecum and the procedure completed.  COMPLICATIONS:  None  ENDOSCOPIC IMPRESSION: 1) Pedunculated polyp at the appendiceal orifice - removed 2) Diminutive polyp in the descending colon - removed 3) Moderate diverticulosis throughout the colon  RECOMMENDATIONS: 1) Repeat Colonoscopy in 3  years.  ______________________________ Wilhemina Bonito. Eda Keys, MD  CC:  Adrian Prince, MD;  The Patient  n. eSIGNED:   Wilhemina Bonito. Eda Keys at 07/04/2012 10:21 AM  [name], [phone]

## 2012-07-04 NOTE — Patient Instructions (Addendum)

## 2012-07-05 ENCOUNTER — Telehealth: Payer: Self-pay | Admitting: *Deleted

## 2012-07-05 NOTE — Telephone Encounter (Signed)
  Follow up Call-  Call back number 07/04/2012  Post procedure Call Back phone  # [phone]  Permission to leave phone message Yes     Patient questions:  Do you have a fever, pain , or abdominal swelling? no Pain Score  0 *  Have you tolerated food without any problems? yes  Have you been able to return to your normal activities? yes  Do you have any questions about your discharge instructions: Diet   no Medications  no Follow up visit  no  Do you have questions or concerns about your Care? no  Actions: * If pain score is 4 or above: No action needed, pain <4.

## 2012-07-10 ENCOUNTER — Encounter: Payer: Self-pay | Admitting: Internal Medicine

## 2012-07-17 IMAGING — CR DG PORTABLE PELVIS
1 series · 1 of 1 positions shown · non-contrast
Comparison: CTA of the abdomen and pelvis performed 07/07/2007, and
pelvic radiograph performed 04/05/2005

CLINICAL DATA: Status post attempted reduction of left hip
dislocation.

PORTABLE PELVIS

[AP]
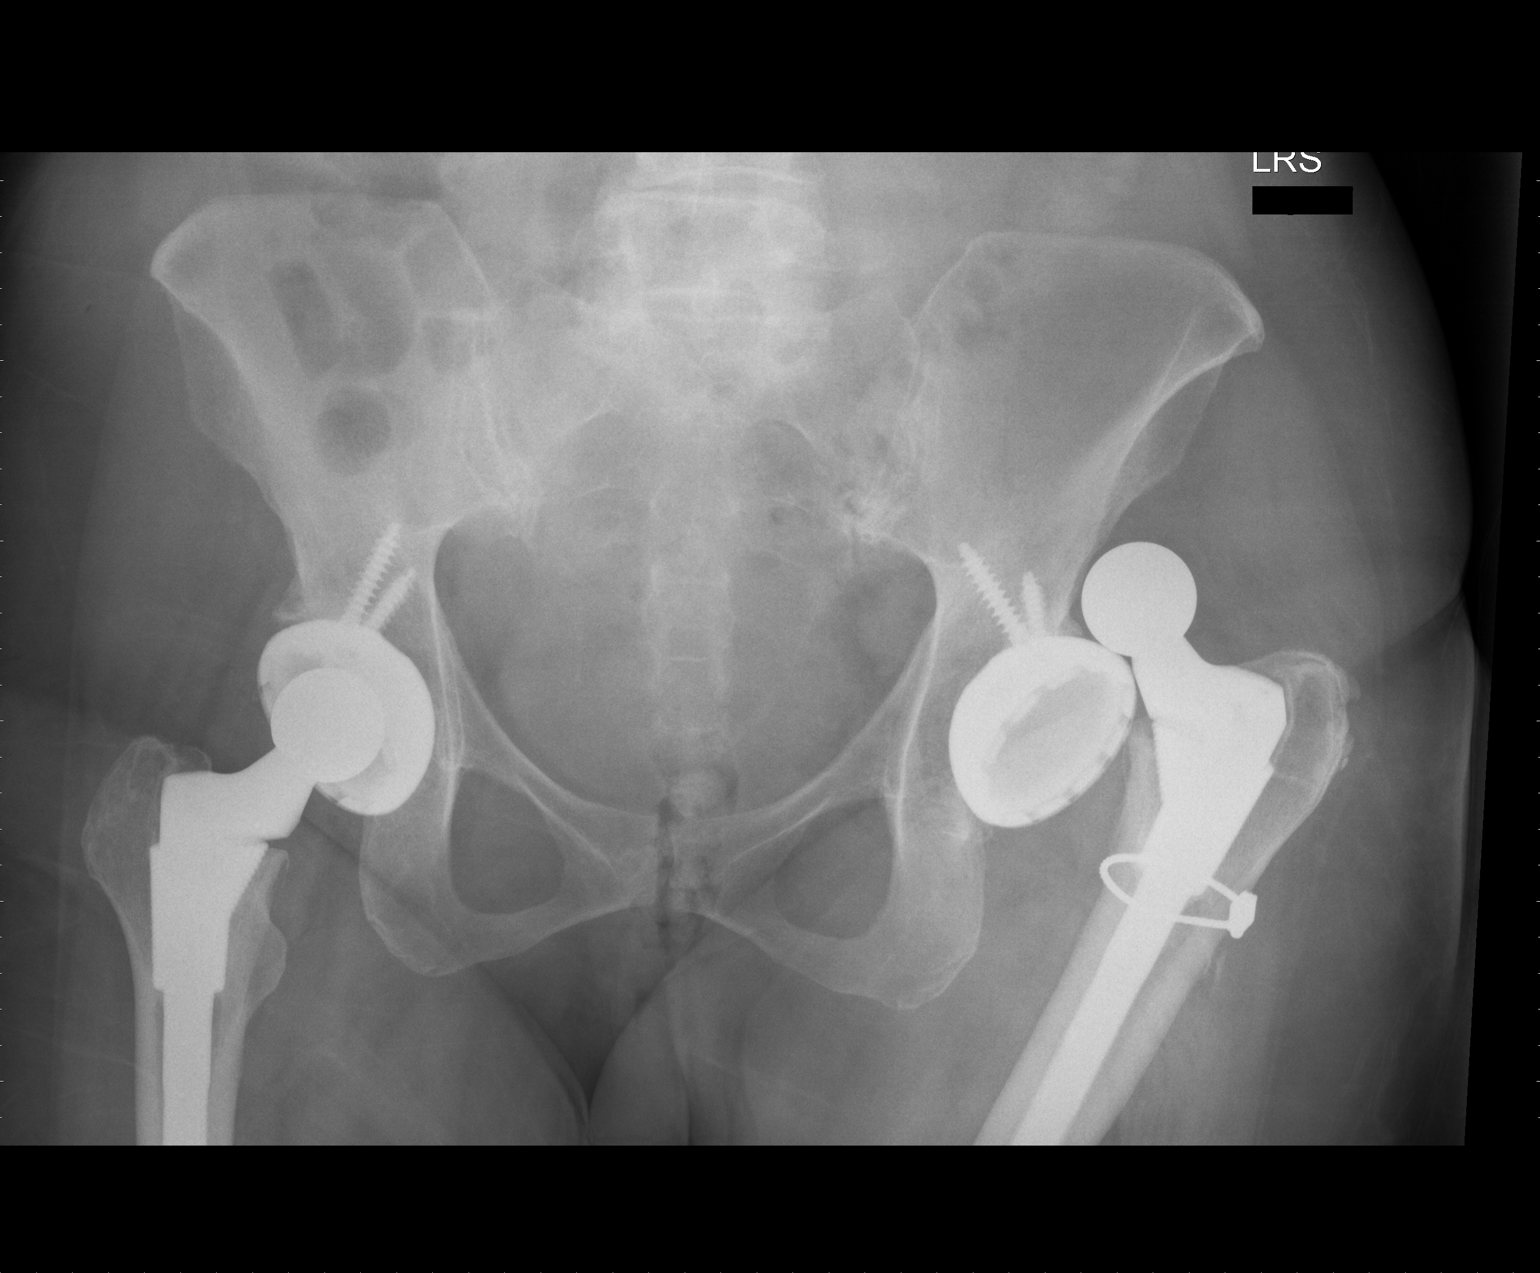

[1 of 1 positions shown; findings below may reference images not displayed]

FINDINGS: There is dislocation of the ball of the patient's left
hip prosthesis, superior to the socket.  No associated fracture is
seen.  Bilateral total hip arthroplasties are noted, with a
cerclage wire seen on the left.  The right hip prosthesis is
unremarkable in appearance.

Sclerotic change is noted at the sacroiliac joints; mild
degenerative change is noted along the lower lumbar spine.  The
visualized bowel gas pattern is grossly unremarkable.  No
significant soft tissue abnormalities are characterized on
radiograph.
IMPRESSION: Dislocation of the patient's left hip prosthesis noted; no
associated fractures seen.

## 2012-07-17 IMAGING — CR DG PORTABLE PELVIS
1 series · 1 of 1 positions shown · non-contrast
Comparison: Pelvis radiograph performed earlier today at [DATE] a.m.

CLINICAL DATA: Status post reduction of left hip dislocation.

PORTABLE PELVIS

[view not recorded]
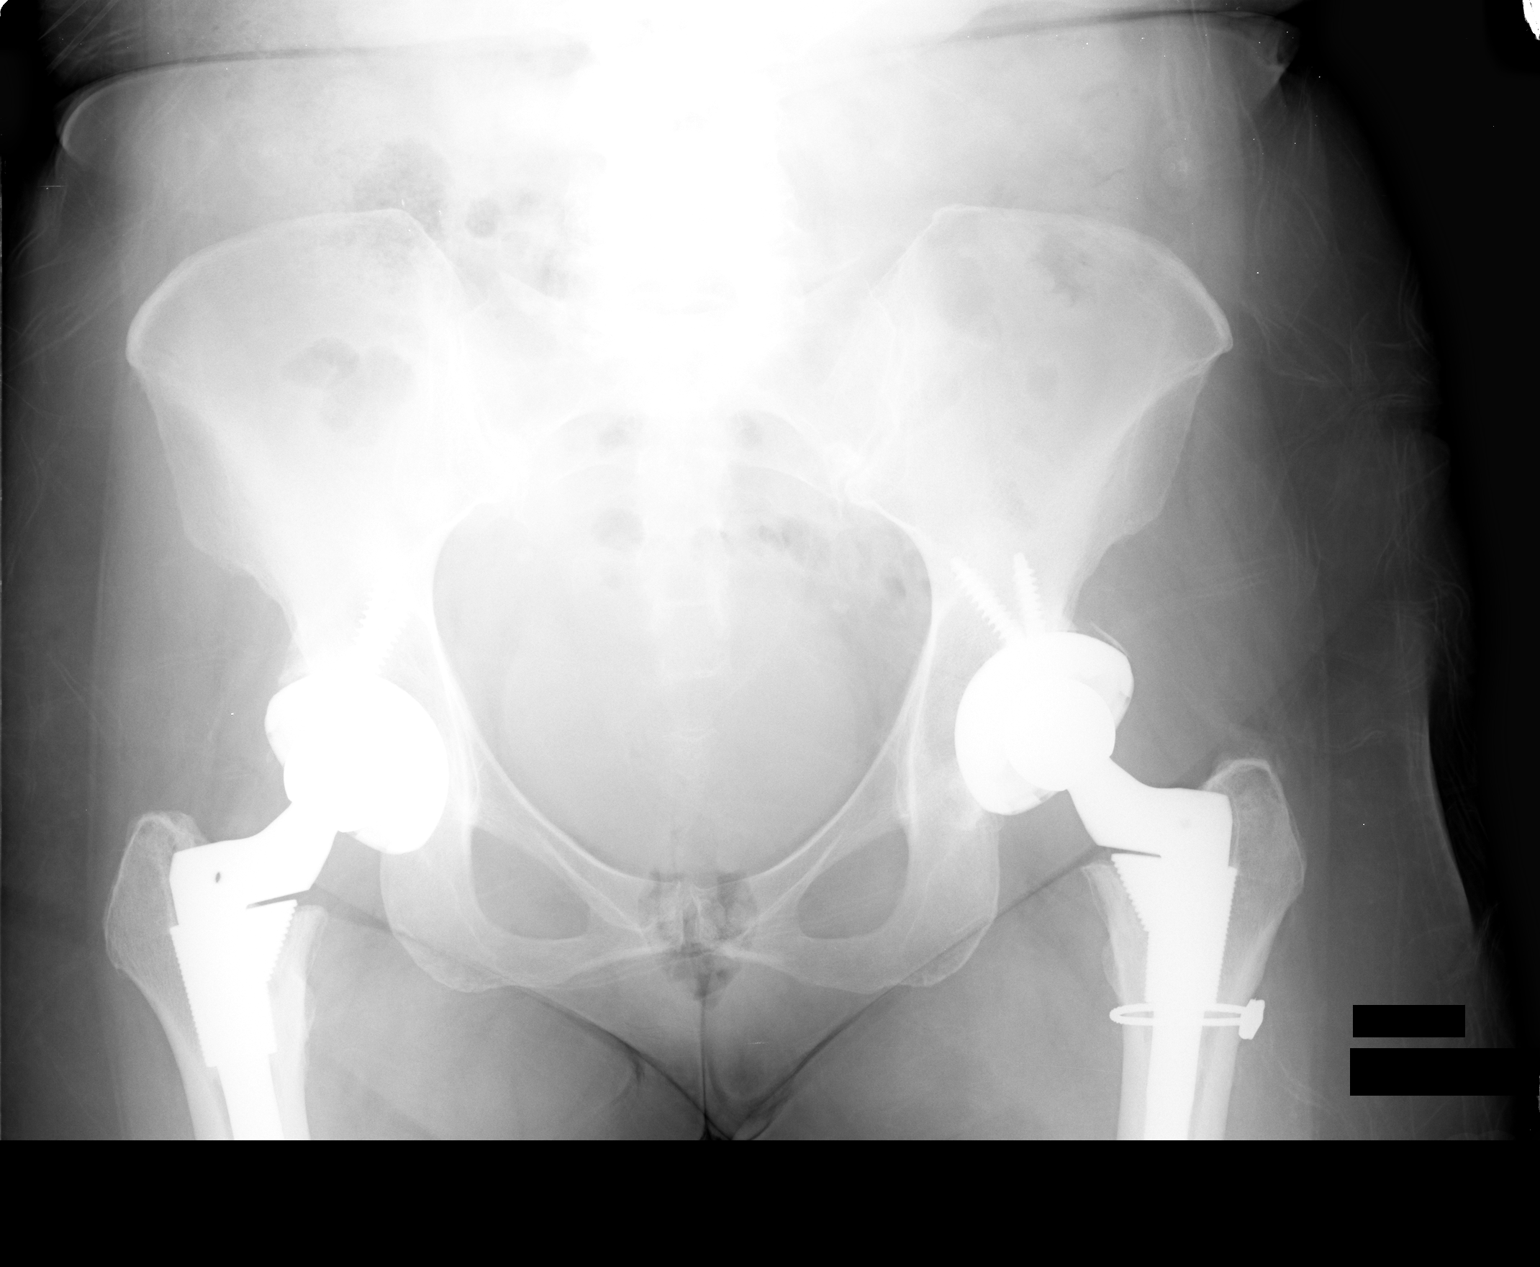

[1 of 1 positions shown; findings below may reference images not displayed]

FINDINGS: There has been successful reduction of the previously
noted left hip prosthesis dislocation.  There appears to be a small
break in the calcification that has formed about the prosthesis.

The bilateral total hip arthroplasties appear intact bilaterally,
without evidence of loosening or new fracture.  The visualized
bowel gas pattern is grossly unremarkable.  No significant soft
tissue abnormalities are characterized on radiograph.
IMPRESSION: Status post reduction of left hip prosthesis dislocation;
previously noted calcification about the prosthesis has been
disrupted.  No evidence of new fracture.

## 2013-07-11 ENCOUNTER — Encounter: Payer: Self-pay | Admitting: Internal Medicine

## 2013-08-13 ENCOUNTER — Encounter: Payer: Self-pay | Admitting: Internal Medicine

## 2013-08-13 ENCOUNTER — Ambulatory Visit (INDEPENDENT_AMBULATORY_CARE_PROVIDER_SITE_OTHER): Payer: Medicare Other | Admitting: Internal Medicine

## 2013-08-13 VITALS — BP 140/80 | HR 66 | Ht 63.5 in | Wt 162.0 lb

## 2013-08-13 DIAGNOSIS — R195 Other fecal abnormalities: Secondary | ICD-10-CM

## 2013-08-13 MED ORDER — OMEPRAZOLE 20 MG PO CPDR: 20.0000 mg | DELAYED_RELEASE_CAPSULE | Freq: Every day | ORAL | Status: DC

## 2013-08-13 NOTE — Patient Instructions (Addendum)
You make take over the counter Prilosec daily to protect your stomach, as you take Aleve

## 2013-08-13 NOTE — Progress Notes (Signed)
HISTORY OF PRESENT ILLNESS:  [name] is a 78 y.o. female with multiple medical problems as listed below. She is sent today, by Dr. Evlyn Lopez, regarding Hemoccult-positive stool as part of her annual comprehensive evaluation. I met the patient on one occasion as a direct referral for surveillance colonoscopy in July of 2013. On her index exam in February 2007 (Dr. Loreta Lopez) a biopsy of a polypoid lesion was a tubulovillous adenoma. On her examination last year, she was found to have a tubulovillous adenoma of the appendiceal orifice which was removed with snare cautery as well as a left-sided diminutive adenoma. Moderate pandiverticulosis as well. Routine followup in 3 years recommended. Her Hemoccult studies were positive in May x2. hemoglobin was normal at 14.4. She does take NSAIDs in the form of Aleve 3-4 times weekly for arthritis. No other NSAIDs or aspirin. She denies obvious rectal bleeding, though has noticed "dark spots" in her stools intermittently. GI review of systems is otherwise negative. In particular, no complaints of abdominal pain, change in bowel habits, or unexplained weight loss.  REVIEW OF SYSTEMS:  All non-GI ROS negative except for arthritis, back pain, dry skin, muscle cramps, sleeping problems  Past Medical History  Diagnosis Date  . Hypertension   . Hyperlipidemia   . Thyroid disease   . Cataract   . RLS (restless legs syndrome)   . Osteopenia   . History of colon polyps     adenomatous  . CAD (coronary artery disease)   . PSVT (paroxysmal supraventricular tachycardia)     Past Surgical History  Procedure Laterality Date  . Cataract surg      left eye  . Hip surgery      bil /left in01/04 and right in 04/06  . Colonoscopy    . Polypectomy    . Heart catherization      07/08  . Peripheral iridotomy      bil eyes  . Lumbar laminectomy      02/17/11  . Total hip arthroplasty      left hip 05/27/11/ and 01/10/12  . Knee arthroscopy      right  . Yag  laser peripheral iridotomy Bilateral     Social History [name]  reports that she has never smoked. She has never used smokeless tobacco. She reports that she does not drink alcohol or use illicit drugs.  family history includes Diabetes in her paternal aunt and paternal grandmother; Heart disease in her brother and father; Lung cancer in her sister.  No Known Allergies     PHYSICAL EXAMINATION: Vital signs: BP 140/80  Pulse 66  Ht 5' 3.5" (1.613 m)  Wt 162 lb (73.483 kg)  BMI 28.24 kg/m2 General: Well-developed, well-nourished, no acute distress HEENT: Sclerae are anicteric, conjunctiva pink. Oral mucosa intact Lungs: Clear Heart: Regular Abdomen: soft, nontender, nondistended, no obvious ascites, no peritoneal signs, normal bowel sounds. No organomegaly. Extremities: No edema Psychiatric: alert and oriented x3. Cooperative   ASSESSMENT:  #1. Hemoccult-positive stool. GI review of systems is essentially negative. Uncertain of the significance of "dark spots" in her stool. Normal hemoglobin. Possible etiologies include NSAID related mucosal injury or colonic neoplasia. #2. History of tubulovillous adenoma and adenomatous polyp. Last colonoscopy July 2013 #3. General medical problems. Stable   PLAN:  #1. I recommended colonoscopy and upper endoscopy to exclude mucosal lesions such as neoplasia or significant ulcers. I discussed with her the nature of the procedures, the risks, and alternatives. Despite understanding the potential risks of  foregoing the procedures, she wishes to forego endoscopic evaluations. She wishes to keep her previously determined surveillance colonoscopy anniversary date for around July 2016. I did recommend that she take a daily PPI for upper GI mucosal protection since she takes NSAIDs regularly. She agreed. I also told her that if for some reason, she or Dr. Evlyn Lopez wish to pursue endoscopic evaluations sooner, we would be happy to accommodate her.  As such, she will resume her general medical care with Dr. Evlyn Lopez.

## 2013-12-05 ENCOUNTER — Encounter: Payer: Self-pay | Admitting: Obstetrics & Gynecology

## 2013-12-06 ENCOUNTER — Ambulatory Visit (INDEPENDENT_AMBULATORY_CARE_PROVIDER_SITE_OTHER): Payer: Medicare Other | Admitting: Obstetrics & Gynecology

## 2013-12-06 ENCOUNTER — Encounter: Payer: Self-pay | Admitting: Obstetrics & Gynecology

## 2013-12-06 DIAGNOSIS — Z01419 Encounter for gynecological examination (general) (routine) without abnormal findings: Secondary | ICD-10-CM

## 2013-12-06 MED ORDER — ESTROGENS, CONJUGATED 0.625 MG/GM VA CREA: TOPICAL_CREAM | VAGINAL | Status: DC

## 2013-12-06 MED ORDER — AZITHROMYCIN 250 MG PO TABS: ORAL_TABLET | ORAL | Status: DC

## 2013-12-06 NOTE — Patient Instructions (Signed)

## 2013-12-06 NOTE — Progress Notes (Signed)
78 y.o. G1P1001 WidowedCaucasianF here for annual exam.  No vaginal bleeding.  Son and daughter in law are going to [place] for the holidays to daughter in laws family.  Saw Dr. Evlyn Kanner in May.   Had guiac positive test.  Saw Dr. Marina Goodell.  Refused additional evaluation.    Reports having a URI with cough for two weeks.  Using cough medicine with codeine.  Didn't take today.  No fevers.   Patient's last menstrual period was 12/27/2000.          Sexually active: no  The current method of family planning is none.    Exercising: no  not regularly Smoker:  no  Health Maintenance: Pap:  04/16/11 WNL History of abnormal Pap:  no MMG:  11/06/13 needs additional views-declines  Colonoscopy:  2013-has recently seen GI for blood in stool BMD:   5/11 TDaP:  PCP Screening Labs: PCP, Hb today: PCP, Urine today: PCP   reports that she has never smoked. She has never used smokeless tobacco. She reports that she does not drink alcohol or use illicit drugs.  Past Medical History  Diagnosis Date  . Hypertension   . Hyperlipidemia   . Cataract   . RLS (restless legs syndrome)   . Osteopenia   . History of colon polyps     adenomatous  . CAD (coronary artery disease)   . PSVT (paroxysmal supraventricular tachycardia)   . Thyroid disease     Hypothyroidism  . Vitamin D deficiency   . Blood in stool     saw GI doctor for evaluation    Past Surgical History  Procedure Laterality Date  . Cataract surg      left eye  . Hip surgery      bil /left in01/04 and right in 04/06  . Colonoscopy    . Polypectomy    . Heart catherization      07/08  . Peripheral iridotomy      bil eyes  . Lumbar laminectomy      02/17/11  . Total hip arthroplasty      left hip 05/27/11/ and 01/10/12  . Knee arthroscopy      right  . Yag laser peripheral iridotomy Bilateral   . Hip arthroplasty Left   . Cataract extraction  2/13    Left eye  . Hip surgery  05/19/13  . Tubal ligation    . Dilation and  curettage of uterus      x2  . Thyroid surgery  1969    Current Outpatient Prescriptions  Medication Sig Dispense Refill  . aspirin 81 MG tablet Take 81 mg by mouth as needed for pain.      . calcitRIOL (ROCALTROL) 0.25 MCG capsule Take 0.25 mcg by mouth daily.      . calcium carbonate (OS-CAL) 600 MG TABS Take 600 mg by mouth 3 (three) times daily.      Marland Kitchen conjugated estrogens (PREMARIN) vaginal cream 1/2 gram pv 2-3 weekly      . hydrochlorothiazide (HYDRODIURIL) 25 MG tablet Take 25 mg by mouth daily.      Marland Kitchen levothyroxine (SYNTHROID, LEVOTHROID) 88 MCG tablet Take 88 mcg by mouth daily. Take 1 daily and 2 on Sundays  (8 weekly)      . lisinopril (PRINIVIL,ZESTRIL) 20 MG tablet Take 20 mg by mouth daily.      . Naproxen Sodium (ALEVE) 220 MG CAPS Take 2 capsules by mouth as needed.      Marland Kitchen omeprazole (PRILOSEC)  20 MG capsule Take 1 capsule (20 mg total) by mouth daily.  20 capsule  0  . pravastatin (PRAVACHOL) 40 MG tablet Take 40 mg by mouth daily.      Marland Kitchen rOPINIRole (REQUIP) 1 MG tablet Take 1 mg by mouth 3 (three) times daily as needed.       Current Facility-Administered Medications  Medication Dose Route Frequency Provider Last Rate Last Dose  . 0.9 %  sodium chloride infusion  500 mL Intravenous Continuous Hilarie Fredrickson, MD        Family History  Problem Relation Age of Onset  . Heart disease Father   . Hypertension Father   . Stroke Father   . Lung cancer Sister   . Hypertension Sister   . Heart disease Brother   . Diabetes Paternal Aunt     x 3  . Diabetes Paternal Grandmother   . Thyroid disease Mother     ROS:  Pertinent items are noted in HPI.  Otherwise, a comprehensive ROS was negative.  Exam:   LMP 12/27/2000  Vital signs reviewed.   Ht Readings from Last 3 Encounters:  08/13/13 5' 3.5" (1.613 m)  07/04/12 5' 3.5" (1.613 m)  06/20/12 5' 3.5" (1.613 m)    General appearance: alert, cooperative and appears stated age Head: Normocephalic, without obvious  abnormality, atraumatic Neck: no adenopathy, supple, symmetrical, trachea midline and thyroid normal to inspection and palpation Lungs: clear to auscultation bilaterally except for wheezes RLL Breasts: normal appearance, no masses or tenderness Heart: regular rate and rhythm Abdomen: soft, non-tender; bowel sounds normal; no masses,  no organomegaly Extremities: extremities normal, atraumatic, no cyanosis or edema Skin: Skin color, texture, turgor normal. No rashes or lesions Lymph nodes: Cervical, supraclavicular, and axillary nodes normal. No abnormal inguinal nodes palpated Neurologic: Grossly normal   Pelvic: External genitalia:  no lesions              Urethra:  normal appearing urethra with no masses, tenderness or lesions              Bartholins and Skenes: normal                 Vagina: normal appearing vagina with normal color and discharge, no lesions              Cervix: no lesions              Pap taken: no Bimanual Exam:  Uterus:  normal size, contour, position, consistency, mobility, non-tender              Adnexa: normal adnexa and no mass, fullness, tenderness               Rectovaginal: Confirms               Anus:  normal sphincter tone, no lesions  A:  Well Woman with normal exam H/O adenomatous polyps.  Colonoscopy 2013 with Dr. Marina Goodell.  Refuses future colonoscopy.  Clearly aware with prior polyps, there could be new ones.  She says even if she had colon cancer she would decline treatment. Cough with wheezes on exam.  Declines CXR.  P:   Mammogram 11/14.  Pt knows abnormal and refused follow up.  Knows I disagree with this.  States she is not going to have anything done even if there was cancer.  Clearly aware there could be a missed cancer present.  Will remove from recall.   Premarin cream 1/2 gm pv twice  weekly prn.  #30 grams/1RF Z pak to pharmacy.  Pt knows to call if cough does not resolve.  She might do a CXR at that time. Declines BMD. Did see Dr. Evlyn Kanner this  year and had lab work. return annually or prn  An After Visit Summary was printed and given to the patient.

## 2014-10-28 ENCOUNTER — Encounter: Payer: Self-pay | Admitting: Obstetrics & Gynecology

## 2014-12-30 ENCOUNTER — Ambulatory Visit: Payer: Medicare Other | Admitting: Obstetrics & Gynecology

## 2015-01-03 ENCOUNTER — Ambulatory Visit: Payer: Medicare Other | Admitting: Obstetrics & Gynecology

## 2015-07-16 ENCOUNTER — Encounter: Payer: Self-pay | Admitting: Internal Medicine

## 2015-09-30 ENCOUNTER — Telehealth: Payer: Self-pay | Admitting: Obstetrics & Gynecology

## 2015-09-30 NOTE — Telephone Encounter (Signed)
Call to patient. Advised will just need to have her future refills requests sent directly to Dr. Baldwin Crown office from the pharmacy. Or call Dr. Forde Dandy directly.  Patient verbalized understanding and will follow up as needed.  Routing to provider for final review. Patient agreeable to disposition. Will close encounter.

## 2015-09-30 NOTE — Telephone Encounter (Signed)
Patient says she has decided to not have anymore pap smears or breast exams and would like to have her prescription for premarin creme switched over to Dr Lorain Childes office.

## 2016-07-01 ENCOUNTER — Ambulatory Visit: Payer: Self-pay | Admitting: Orthopedic Surgery

## 2016-07-01 NOTE — Progress Notes (Signed)
Preoperative surgical orders have been place into the Epic hospital system for [name] on 07/01/2016, 12:16 PM  by Mickel Crow for surgery on 07/12/2016.  Preop Total Knee orders including Experal, IV Tylenol, and IV Decadron as long as there are no contraindications to the above medications. Arlee Muslim, PA-C

## 2016-07-02 NOTE — Patient Instructions (Addendum)
[name]  07/02/2016   Your procedure is scheduled on: Monday 07/12/2016  Report to [facility] Main  Entrance take [place]  elevators to 3rd floor to  [place] at  [place] AM.  Call this number if you have problems the morning of surgery [phone]   Remember: ONLY 1 PERSON MAY GO WITH YOU TO SHORT STAY TO GET  READY MORNING OF [place].   Do not eat food or drink liquids :After Midnight.     Take these medicines the morning of surgery with A SIP OF WATER:  Levothyroxine, Omeprazole                                 You may not have any metal on your body including hair pins and              piercings  Do not wear jewelry, make-up, lotions, powders or perfumes, deodorant             Do not wear nail polish.  Do not shave  48 hours prior to surgery.              Men may shave face and neck.   Do not bring valuables to the hospital. [place].  Contacts, dentures or bridgework may not be worn into surgery.  Leave suitcase in the car. After surgery it may be brought to your room.                  Please read over the following fact sheets you were given: _____________________________________________________________________             [facility] - Preparing for Surgery Before surgery, you can play an important role.  Because skin is not sterile, your skin needs to be as free of germs as possible.  You can reduce the number of germs on your skin by washing with CHG (chlorahexidine gluconate) soap before surgery.  CHG is an antiseptic cleaner which kills germs and bonds with the skin to continue killing germs even after washing. Please DO NOT use if you have an allergy to CHG or antibacterial soaps.  If your skin becomes reddened/irritated stop using the CHG and inform your nurse when you arrive at Short Stay. Do not shave (including legs and underarms) for at least 48 hours prior to the first  CHG shower.  You may shave your face/neck. Please follow these instructions carefully:  1.  Shower with CHG Soap the night before surgery and the  morning of Surgery.  2.  If you choose to wash your hair, wash your hair first as usual with your  normal  shampoo.  3.  After you shampoo, rinse your hair and body thoroughly to remove the  shampoo.                           4.  Use CHG as you would any other liquid soap.  You can apply chg directly  to the skin and wash                       Gently with a scrungie or clean washcloth.  5.  Apply the CHG Soap to your body ONLY FROM THE NECK DOWN.   Do not use on face/ open                           Wound or open sores. Avoid contact with eyes, ears mouth and genitals (private parts).                       Wash face,  Genitals (private parts) with your normal soap.             6.  Wash thoroughly, paying special attention to the area where your surgery  will be performed.  7.  Thoroughly rinse your body with warm water from the neck down.  8.  DO NOT shower/wash with your normal soap after using and rinsing off  the CHG Soap.                9.  Pat yourself dry with a clean towel.            10.  Wear clean pajamas.            11.  Place clean sheets on your bed the night of your first shower and do not  sleep with pets. Day of Surgery : Do not apply any lotions/deodorants the morning of surgery.  Please wear clean clothes to the hospital/surgery center.  FAILURE TO FOLLOW THESE INSTRUCTIONS MAY RESULT IN THE CANCELLATION OF YOUR SURGERY PATIENT SIGNATURE_________________________________  NURSE SIGNATURE__________________________________  ________________________________________________________________________   Adam Phenix  An incentive spirometer is a tool that can help keep your lungs clear and active. This tool measures how well you are filling your lungs with each breath. Taking long deep breaths may help reverse or decrease the  chance of developing breathing (pulmonary) problems (especially infection) following:  A long period of time when you are unable to move or be active. BEFORE THE PROCEDURE   If the spirometer includes an indicator to show your best effort, your nurse or respiratory therapist will set it to a desired goal.  If possible, sit up straight or lean slightly forward. Try not to slouch.  Hold the incentive spirometer in an upright position. INSTRUCTIONS FOR USE   Sit on the edge of your bed if possible, or sit up as far as you can in bed or on a chair.  Hold the incentive spirometer in an upright position.  Breathe out normally.  Place the mouthpiece in your mouth and seal your lips tightly around it.  Breathe in slowly and as deeply as possible, raising the piston or the ball toward the top of the column.  Hold your breath for 3-5 seconds or for as long as possible. Allow the piston or ball to fall to the bottom of the column.  Remove the mouthpiece from your mouth and breathe out normally.  Rest for a few seconds and repeat Steps 1 through 7 at least 10 times every 1-2 hours when you are awake. Take your time and take a few normal breaths between deep breaths.  The spirometer may include an indicator to show your best effort. Use the indicator as a goal to work toward during each repetition.  After each set of 10 deep breaths, practice coughing to be sure your lungs are clear. If you have an incision (the cut made at the time of surgery), support your incision when coughing by placing a  pillow or rolled up towels firmly against it. Once you are able to get out of bed, walk around indoors and cough well. You may stop using the incentive spirometer when instructed by your caregiver.  RISKS AND COMPLICATIONS  Take your time so you do not get dizzy or light-headed.  If you are in pain, you may need to take or ask for pain medication before doing incentive spirometry. It is harder to take a  deep breath if you are having pain. AFTER USE  Rest and breathe slowly and easily.  It can be helpful to keep track of a log of your progress. Your caregiver can provide you with a simple table to help with this. If you are using the spirometer at home, follow these instructions: [place] IF:   You are having difficultly using the spirometer.  You have trouble using the spirometer as often as instructed.  Your pain medication is not giving enough relief while using the spirometer.  You develop fever of 100.5 F (38.1 C) or higher. SEEK IMMEDIATE MEDICAL CARE IF:   You cough up bloody sputum that had not been present before.  You develop fever of 102 F (38.9 C) or greater.  You develop worsening pain at or near the incision site. MAKE SURE YOU:   Understand these instructions.  Will watch your condition.  Will get help right away if you are not doing well or get worse. Document Released: 04/25/2007 Document Revised: 03/06/2012 Document Reviewed: 06/26/2007 ExitCare Patient Information 2014 ExitCare, [place].   ________________________________________________________________________  WHAT IS A BLOOD TRANSFUSION? Blood Transfusion Information  A transfusion is the replacement of blood or some of its parts. Blood is made up of multiple cells which provide different functions.  Red blood cells carry oxygen and are used for blood loss replacement.  White blood cells fight against infection.  Platelets control bleeding.  Plasma helps clot blood.  Other blood products are available for specialized needs, such as hemophilia or other clotting disorders. BEFORE THE TRANSFUSION  Who gives blood for transfusions?   Healthy volunteers who are fully evaluated to make sure their blood is safe. This is blood bank blood. Transfusion therapy is the safest it has ever been in the practice of medicine. Before blood is taken from a donor, a complete history is taken to make  sure that person has no history of diseases nor engages in risky social behavior (examples are intravenous drug use or sexual activity with multiple partners). The donor's travel history is screened to minimize risk of transmitting infections, such as malaria. The donated blood is tested for signs of infectious diseases, such as HIV and hepatitis. The blood is then tested to be sure it is compatible with you in order to minimize the chance of a transfusion reaction. If you or a relative donates blood, this is often done in anticipation of surgery and is not appropriate for emergency situations. It takes many days to process the donated blood. RISKS AND COMPLICATIONS Although transfusion therapy is very safe and saves many lives, the main dangers of transfusion include:   Getting an infectious disease.  Developing a transfusion reaction. This is an allergic reaction to something in the blood you were given. Every precaution is taken to prevent this. The decision to have a blood transfusion has been considered carefully by your caregiver before blood is given. Blood is not given unless the benefits outweigh the risks. AFTER THE TRANSFUSION  Right after receiving a blood transfusion, you  will usually feel much better and more energetic. This is especially true if your red blood cells have gotten low (anemic). The transfusion raises the level of the red blood cells which carry oxygen, and this usually causes an energy increase.  The nurse administering the transfusion will monitor you carefully for complications. HOME CARE INSTRUCTIONS  No special instructions are needed after a transfusion. You may find your energy is better. Speak with your caregiver about any limitations on activity for underlying diseases you may have. SEEK MEDICAL CARE IF:   Your condition is not improving after your transfusion.  You develop redness or irritation at the intravenous (IV) site. SEEK IMMEDIATE MEDICAL CARE IF:   Any of the following symptoms occur over the next 12 hours:  Shaking chills.  You have a temperature by mouth above 102 F (38.9 C), not controlled by medicine.  Chest, back, or muscle pain.  People around you feel you are not acting correctly or are confused.  Shortness of breath or difficulty breathing.  Dizziness and fainting.  You get a rash or develop hives.  You have a decrease in urine output.  Your urine turns a dark color or changes to pink, red, or brown. Any of the following symptoms occur over the next 10 days:  You have a temperature by mouth above 102 F (38.9 C), not controlled by medicine.  Shortness of breath.  Weakness after normal activity.  The white part of the eye turns yellow (jaundice).  You have a decrease in the amount of urine or are urinating less often.  Your urine turns a dark color or changes to pink, red, or brown. Document Released: 12/10/2000 Document Revised: 03/06/2012 Document Reviewed: 07/29/2008 [facility] Patient Information 2014 [place], [place].  _______________________________________________________________________

## 2016-07-05 ENCOUNTER — Encounter (HOSPITAL_COMMUNITY)
Admission: RE | Admit: 2016-07-05 | Discharge: 2016-07-05 | Disposition: A | Discharge status: Home / Self Care | Payer: Medicare Other | Source: Ambulatory Visit | Attending: Orthopedic Surgery | Admitting: Orthopedic Surgery

## 2016-07-05 ENCOUNTER — Encounter (HOSPITAL_COMMUNITY): Payer: Self-pay

## 2016-07-05 DIAGNOSIS — Z01812 Encounter for preprocedural laboratory examination: Secondary | ICD-10-CM | POA: Diagnosis present

## 2016-07-05 DIAGNOSIS — Z01810 Encounter for preprocedural cardiovascular examination: Secondary | ICD-10-CM | POA: Insufficient documentation

## 2016-07-05 HISTORY — DX: Gastro-esophageal reflux disease without esophagitis: K21.9

## 2016-07-05 HISTORY — DX: Hypothyroidism, unspecified: E03.9

## 2016-07-05 HISTORY — DX: Unspecified osteoarthritis, unspecified site: M19.90

## 2016-07-05 LAB — URINALYSIS, ROUTINE W REFLEX MICROSCOPIC
Bilirubin Urine: NEGATIVE
Glucose, UA: NEGATIVE mg/dL
HGB URINE DIPSTICK: NEGATIVE
Ketones, ur: NEGATIVE mg/dL
LEUKOCYTES UA: NEGATIVE
Nitrite: NEGATIVE
PROTEIN: NEGATIVE mg/dL
Specific Gravity, Urine: 1.008 (ref 1.005–1.030)
pH: 6.5 (ref 5.0–8.0)

## 2016-07-05 LAB — CBC
HCT: 42.7 % (ref 36.0–46.0)
HEMOGLOBIN: 14.2 g/dL (ref 12.0–15.0)
MCH: 31.1 pg (ref 26.0–34.0)
MCHC: 33.3 g/dL (ref 30.0–36.0)
MCV: 93.4 fL (ref 78.0–100.0)
Platelets: 214 10*3/uL (ref 150–400)
RBC: 4.57 MIL/uL (ref 3.87–5.11)
RDW: 13.6 % (ref 11.5–15.5)
WBC: 6.3 10*3/uL (ref 4.0–10.5)

## 2016-07-05 LAB — COMPREHENSIVE METABOLIC PANEL
ALBUMIN: 4.1 g/dL (ref 3.5–5.0)
ALK PHOS: 89 U/L (ref 38–126)
ALT: 14 U/L (ref 14–54)
AST: 23 U/L (ref 15–41)
Anion gap: 8 (ref 5–15)
BUN: 28 mg/dL — AB (ref 6–20)
CALCIUM: 8.8 mg/dL — AB (ref 8.9–10.3)
CO2: 29 mmol/L (ref 22–32)
CREATININE: 1.23 mg/dL — AB (ref 0.44–1.00)
Chloride: 101 mmol/L (ref 101–111)
GFR calc Af Amer: 47 mL/min — ABNORMAL LOW (ref >60)
GFR calc non Af Amer: 41 mL/min — ABNORMAL LOW (ref >60)
GLUCOSE: 91 mg/dL (ref 65–99)
Potassium: 4.1 mmol/L (ref 3.5–5.1)
SODIUM: 138 mmol/L (ref 135–145)
Total Bilirubin: 1.1 mg/dL (ref 0.3–1.2)
Total Protein: 7.3 g/dL (ref 6.5–8.1)

## 2016-07-05 LAB — PROTIME-INR
INR: 1.03 (ref 0.00–1.49)
Prothrombin Time: 13.7 seconds (ref 11.6–15.2)

## 2016-07-05 LAB — APTT: APTT: 29 s (ref 24–37)

## 2016-07-05 LAB — SURGICAL PCR SCREEN
MRSA, PCR: NEGATIVE
STAPHYLOCOCCUS AUREUS: NEGATIVE

## 2016-07-05 LAB — ABO/RH: ABO/RH(D): O NEG

## 2016-07-11 ENCOUNTER — Ambulatory Visit: Payer: Self-pay | Admitting: Orthopedic Surgery

## 2016-07-11 NOTE — H&P (Signed)
[name] DOB: 09-26-1935 Married / Language: English / Race: White Female Date of Admission:  07/12/16 CC:  Right Knee Pain History of Present Illness  The patient is a 81 year old female who comes in for a preoperative History and Physical. The patient is scheduled for a right total knee arthroplasty to be performed by Dr. Dione Plover. Aluisio, MD at [facility] on 07/12/2016. The patient is a 81 year old female who presented for follow up of their knee. The patient is being followed for their right knee pain and osteoarthritis. They are now over a year out from cortisone injection. Symptoms reported include: pain, swelling, aching, catching and difficulty ambulating. The patient feels that they are doing poorly and report their pain level to be moderate to severe. The following medication has been used for pain control: antiinflammatory medication (Aleve) and Ultram. The patient has not gotten any relief of their symptoms with Cortisone injections. Her right knee has given her progressively more problems. She is hurting at all times now. She will even hurt at night. It occasionally keeps her awake. It is something which she can and cannot do. Injections have not helped much in the past. She would like to get the right knee fixed at this time. They have been treated conservatively in the past for the above stated problem and despite conservative measures, they continue to have progressive pain and severe functional limitations and dysfunction. They have failed non-operative management including home exercise, medications, and injections. It is felt that they would benefit from undergoing total joint replacement. Risks and benefits of the procedure have been discussed with the patient and they elect to proceed with surgery. There are no active contraindications to surgery such as ongoing infection or rapidly progressive neurological disease.  Problem List/Past Medical Dislocation of hip  prosthesis (996.42)  Primary osteoarthritis of one knee, right (M17.11)  Hip discomfort, right (M25.551)  High blood pressure  Chronic Cystitis  Gout  Hypercholesterolemia  Hyperthyroidism  S/P Partial Thyroidectomy Hypothyroidism  Post-surgical Osteoporosis  S/P Dislocation THA  Cataract  Hemorrhoids  Allergies No Known Drug Allergies  Family History  Cancer  Sister. Cerebrovascular Accident  Father. Diabetes Mellitus  Paternal Grandmother. Heart Disease  Father. Hypertension  Father.  Social History Tobacco use  Never smoker. 09/26/2014 Children  1 Current work status  retired Furniture conservator/restorer rarely; does other Living situation  live alone Marital status  widowed Never consumed alcohol  09/26/2014: Never consumed alcohol No history of drug/alcohol rehab  Not under pain contract  Number of flights of stairs before winded  greater than 5 Post-Surgical Plans  Home Advance Directives  Living Will, Healthcare POA  Medication History  Hydrochlorothiazide (25MG  Tablet, Oral daily) Active. Calcitriol (0.25MCG Capsule, Oral daily) Active. Synthroid (88MCG Tablet, Oral daily) Active. ROPINIRole HCl (1MG  Tablet, Oral three times daily, as needed) Active. Calcium Carbonate (600MG  Tablet, Oral three times daily) Active. Lisinopril (20MG  Tablet, Oral daily) Active. Pravastatin Sodium (40MG  Tablet, Oral daily) Active. Omeprazole (20MG  Tablet DR, Oral daily) Active. TraMADol HCl (50MG  Tablet, Oral daily) Active. Aleve (220MG  Tablet, Oral as needed) Active.    Past Surgical History Thyroidectomy; Subtotal  Date: 1969. Total Hip Replacement - Left  Date: 12/2002. Total Hip Replacement  Date: 03/2005. right Heart Cath  Date: 06/2007. YAG Laser Eye Surgery Bilateral  Date: 2010. Lumbar Laminectomy  Date: 01/2011. Cataract Surgery  Date: 01/2012. left Colon Polyp Removal - Colonoscopy  Date: 06/2012. Arthroscopy of Knee   right  Dilation and Curettage of Uterus  Tubal Ligation    Review of Systems (Alezandrew L. Mistina Coatney III PA-C; 07/01/2016 3:42 PM) General Not Present- Chills, Fatigue, Fever, Memory Loss, Night Sweats, Weight Gain and Weight Loss. Skin Not Present- Eczema, Hives, Itching, Lesions and Rash. HEENT Not Present- Dentures, Double Vision, Headache, Hearing Loss, Tinnitus and Visual Loss. Respiratory Not Present- Allergies, Chronic Cough, Coughing up blood, Shortness of breath at rest and Shortness of breath with exertion. Cardiovascular Not Present- Chest Pain, Difficulty Breathing Lying Down, Murmur, Palpitations, Racing/skipping heartbeats and Swelling. Gastrointestinal Not Present- Abdominal Pain, Bloody Stool, Constipation, Diarrhea, Difficulty Swallowing, Heartburn, Jaundice, Loss of appetitie, Nausea and Vomiting. Female Genitourinary Not Present- Blood in Urine, Discharge, Flank Pain, Incontinence, Painful Urination, Urgency, Urinary frequency, Urinary Retention, Urinating at Night and Weak urinary stream. Musculoskeletal Present- Joint Pain, Joint Swelling, Morning Stiffness and Muscle Pain. Not Present- Back Pain, Muscle Weakness and Spasms. Neurological Not Present- Blackout spells, Difficulty with balance, Dizziness, Paralysis, Tremor and Weakness. Psychiatric Not Present- Insomnia.  Vitals Weight: 164 lb Height: 63.5in Weight was reported by patient. Height was reported by patient. Body Surface Area: 1.79 m Body Mass Index: 28.6 kg/m  Pulse: 60 (Regular)  BP: 130/78 (Sitting, Right Arm, Standard)   Physical Exam  General Mental Status -Alert, cooperative and good historian. General Appearance-pleasant, Not in acute distress. Orientation-Oriented X3. Build & Nutrition-Well nourished and Well developed.  Head and Neck Head-normocephalic, atraumatic . Neck Global Assessment - supple, no bruit auscultated on the right, no bruit auscultated on the  left.  Eye Pupil - Bilateral-Regular and Round. Motion - Bilateral-EOMI.  Chest and Lung Exam Auscultation Breath sounds - clear at anterior chest wall and clear at posterior chest wall. Adventitious sounds - No Adventitious sounds.  Cardiovascular Auscultation Rhythm - Regular rate and rhythm. Heart Sounds - S1 WNL and S2 WNL. Murmurs & Other Heart Sounds - Auscultation of the heart reveals - No Murmurs.  Abdomen Palpation/Percussion Tenderness - Abdomen is non-tender to palpation. Rigidity (guarding) - Abdomen is soft. Auscultation Auscultation of the abdomen reveals - Bowel sounds normal.  Female Genitourinary Note: Not done, not pertinent to present illness   Musculoskeletal Note: On exam, she is alert and oriented, in no apparent distress. Her right knee shows a valgus deformity. Her right hip has normal range of motion with no discomfort. Her right knee range about 5 to 125 degrees. Has moderate crepitus on range of motion. She is tender anterior and laterally. There is no stability about the knee.  RADIOGRAPHS AP both knees and lateral show bone-on-bone arthritis in the lateral compartment of the right knee with subchondral cystic changes in the medial femoral condyle. She also has patellofemoral arthritis with a lot of spurring.   Assessment & Plan Primary osteoarthritis of one knee, right (M17.11)  Note:Surgical Plans: Right Total Knee Replacement  Disposition: Home  PCP: Dr. Forde Dandy - Patient has been seen preoperatively and felt to be stable for surgery.  Topical TXA - CAD  Anesthesia Issues: None  Signed electronically by Ok Edwards, III PA-C

## 2016-07-12 ENCOUNTER — Encounter (HOSPITAL_COMMUNITY)
Admission: RE | Disposition: A | Discharge status: Home Health | Payer: Self-pay | Source: Ambulatory Visit | Attending: Orthopedic Surgery

## 2016-07-12 ENCOUNTER — Inpatient Hospital Stay (HOSPITAL_COMMUNITY)
Admission: RE | Admit: 2016-07-12 | Discharge: 2016-07-14 | DRG: 470 | Disposition: A | Discharge status: Home Health | Payer: Medicare Other | Source: Ambulatory Visit | Attending: Orthopedic Surgery | Admitting: Orthopedic Surgery

## 2016-07-12 ENCOUNTER — Inpatient Hospital Stay (HOSPITAL_COMMUNITY): Payer: Medicare Other | Admitting: Registered Nurse

## 2016-07-12 ENCOUNTER — Encounter (HOSPITAL_COMMUNITY): Payer: Self-pay

## 2016-07-12 DIAGNOSIS — M810 Age-related osteoporosis without current pathological fracture: Secondary | ICD-10-CM | POA: Diagnosis present

## 2016-07-12 DIAGNOSIS — E785 Hyperlipidemia, unspecified: Secondary | ICD-10-CM | POA: Diagnosis present

## 2016-07-12 DIAGNOSIS — Z79899 Other long term (current) drug therapy: Secondary | ICD-10-CM | POA: Diagnosis not present

## 2016-07-12 DIAGNOSIS — M25561 Pain in right knee: Secondary | ICD-10-CM | POA: Diagnosis present

## 2016-07-12 DIAGNOSIS — I2510 Atherosclerotic heart disease of native coronary artery without angina pectoris: Secondary | ICD-10-CM | POA: Diagnosis present

## 2016-07-12 DIAGNOSIS — E890 Postprocedural hypothyroidism: Secondary | ICD-10-CM | POA: Diagnosis present

## 2016-07-12 DIAGNOSIS — Z96643 Presence of artificial hip joint, bilateral: Secondary | ICD-10-CM | POA: Diagnosis present

## 2016-07-12 DIAGNOSIS — M1710 Unilateral primary osteoarthritis, unspecified knee: Secondary | ICD-10-CM | POA: Diagnosis present

## 2016-07-12 DIAGNOSIS — M21061 Valgus deformity, not elsewhere classified, right knee: Secondary | ICD-10-CM | POA: Diagnosis present

## 2016-07-12 DIAGNOSIS — I10 Essential (primary) hypertension: Secondary | ICD-10-CM | POA: Diagnosis present

## 2016-07-12 DIAGNOSIS — M1711 Unilateral primary osteoarthritis, right knee: Secondary | ICD-10-CM | POA: Diagnosis present

## 2016-07-12 DIAGNOSIS — M179 Osteoarthritis of knee, unspecified: Secondary | ICD-10-CM | POA: Diagnosis present

## 2016-07-12 DIAGNOSIS — K219 Gastro-esophageal reflux disease without esophagitis: Secondary | ICD-10-CM | POA: Diagnosis present

## 2016-07-12 HISTORY — PX: TOTAL KNEE ARTHROPLASTY: SHX125

## 2016-07-12 LAB — TYPE AND SCREEN
ABO/RH(D): O NEG
Antibody Screen: NEGATIVE

## 2016-07-12 SURGERY — ARTHROPLASTY, KNEE, TOTAL
Anesthesia: Spinal | Site: Knee | Laterality: Right

## 2016-07-12 MED ORDER — DIPHENHYDRAMINE HCL 12.5 MG/5ML PO ELIX: 12.5000 mg | ORAL_SOLUTION | ORAL | Status: DC | PRN

## 2016-07-12 MED ORDER — TRAMADOL HCL 50 MG PO TABS
50.0000 mg | ORAL_TABLET | Freq: Four times a day (QID) | ORAL | Status: DC | PRN
  Administered 2016-07-13: 100 mg via ORAL
  Filled 2016-07-12: qty 2

## 2016-07-12 MED ORDER — BISACODYL 10 MG RE SUPP: 10.0000 mg | Freq: Every day | RECTAL | Status: DC | PRN

## 2016-07-12 MED ORDER — PROPOFOL 10 MG/ML IV BOLUS
INTRAVENOUS | Status: DC | PRN
  Administered 2016-07-12 (×2): 20 mg via INTRAVENOUS

## 2016-07-12 MED ORDER — POLYETHYLENE GLYCOL 3350 17 G PO PACK: 17.0000 g | PACK | Freq: Every day | ORAL | Status: DC | PRN

## 2016-07-12 MED ORDER — HYDROMORPHONE HCL 1 MG/ML IJ SOLN: 0.2500 mg | INTRAMUSCULAR | Status: DC | PRN

## 2016-07-12 MED ORDER — ACETAMINOPHEN 325 MG PO TABS
650.0000 mg | ORAL_TABLET | Freq: Four times a day (QID) | ORAL | Status: DC | PRN
  Administered 2016-07-13: 650 mg via ORAL
  Filled 2016-07-12: qty 2

## 2016-07-12 MED ORDER — METOCLOPRAMIDE HCL 5 MG PO TABS: 5.0000 mg | ORAL_TABLET | Freq: Three times a day (TID) | ORAL | Status: DC | PRN

## 2016-07-12 MED ORDER — STERILE WATER FOR IRRIGATION IR SOLN
Status: DC | PRN
  Administered 2016-07-12: 2000 mL

## 2016-07-12 MED ORDER — ACETAMINOPHEN 10 MG/ML IV SOLN
INTRAVENOUS | Status: AC
  Filled 2016-07-12: qty 100

## 2016-07-12 MED ORDER — PANTOPRAZOLE SODIUM 40 MG PO TBEC: 40.0000 mg | DELAYED_RELEASE_TABLET | Freq: Every day | ORAL | Status: DC

## 2016-07-12 MED ORDER — BUPIVACAINE LIPOSOME 1.3 % IJ SUSP
20.0000 mL | Freq: Once | INTRAMUSCULAR | Status: DC
  Filled 2016-07-12: qty 20

## 2016-07-12 MED ORDER — CEFAZOLIN SODIUM-DEXTROSE 2-4 GM/100ML-% IV SOLN
2.0000 g | INTRAVENOUS | Status: AC
  Administered 2016-07-12: 2 g via INTRAVENOUS

## 2016-07-12 MED ORDER — FLEET ENEMA 7-19 GM/118ML RE ENEM: 1.0000 | ENEMA | Freq: Once | RECTAL | Status: DC | PRN

## 2016-07-12 MED ORDER — PROPOFOL 500 MG/50ML IV EMUL
INTRAVENOUS | Status: DC | PRN
  Administered 2016-07-12: 50 ug/kg/min via INTRAVENOUS

## 2016-07-12 MED ORDER — METHOCARBAMOL 500 MG PO TABS
500.0000 mg | ORAL_TABLET | Freq: Four times a day (QID) | ORAL | Status: DC | PRN
  Administered 2016-07-12 – 2016-07-13 (×5): 500 mg via ORAL
  Filled 2016-07-12 (×5): qty 1

## 2016-07-12 MED ORDER — CEFAZOLIN SODIUM-DEXTROSE 2-4 GM/100ML-% IV SOLN
2.0000 g | Freq: Four times a day (QID) | INTRAVENOUS | Status: AC
  Administered 2016-07-12 (×2): 2 g via INTRAVENOUS
  Filled 2016-07-12 (×2): qty 100

## 2016-07-12 MED ORDER — PHENOL 1.4 % MT LIQD
1.0000 | OROMUCOSAL | Status: DC | PRN
Start: 2016-07-12 — End: 2016-07-14
  Filled 2016-07-12: qty 177

## 2016-07-12 MED ORDER — ROPINIROLE HCL 1 MG PO TABS
1.0000 mg | ORAL_TABLET | Freq: Three times a day (TID) | ORAL | Status: DC | PRN
  Administered 2016-07-12 – 2016-07-13 (×4): 1 mg via ORAL
  Filled 2016-07-12 (×4): qty 1

## 2016-07-12 MED ORDER — TRANEXAMIC ACID 1000 MG/10ML IV SOLN
2000.0000 mg | INTRAVENOUS | Status: DC | PRN
  Administered 2016-07-12: 2000 mg via TOPICAL

## 2016-07-12 MED ORDER — ONDANSETRON HCL 4 MG/2ML IJ SOLN
INTRAMUSCULAR | Status: AC
  Filled 2016-07-12: qty 2

## 2016-07-12 MED ORDER — 0.9 % SODIUM CHLORIDE (POUR BTL) OPTIME
TOPICAL | Status: DC | PRN
Start: 2016-07-12 — End: 2016-07-12
  Administered 2016-07-12: 1000 mL

## 2016-07-12 MED ORDER — CEFAZOLIN SODIUM-DEXTROSE 2-4 GM/100ML-% IV SOLN
INTRAVENOUS | Status: AC
  Filled 2016-07-12: qty 100

## 2016-07-12 MED ORDER — MIDAZOLAM HCL 2 MG/2ML IJ SOLN
INTRAMUSCULAR | Status: AC
  Filled 2016-07-12: qty 2

## 2016-07-12 MED ORDER — ACETAMINOPHEN 325 MG PO TABS: 325.0000 mg | ORAL_TABLET | ORAL | Status: DC | PRN

## 2016-07-12 MED ORDER — ONDANSETRON HCL 4 MG PO TABS: 4.0000 mg | ORAL_TABLET | Freq: Four times a day (QID) | ORAL | Status: DC | PRN

## 2016-07-12 MED ORDER — ACETAMINOPHEN 160 MG/5ML PO SOLN: 325.0000 mg | ORAL | Status: DC | PRN

## 2016-07-12 MED ORDER — ONDANSETRON HCL 4 MG/2ML IJ SOLN: 4.0000 mg | Freq: Four times a day (QID) | INTRAMUSCULAR | Status: DC | PRN

## 2016-07-12 MED ORDER — OXYCODONE HCL 5 MG/5ML PO SOLN
5.0000 mg | Freq: Once | ORAL | Status: DC | PRN
  Filled 2016-07-12: qty 5

## 2016-07-12 MED ORDER — METOCLOPRAMIDE HCL 5 MG/ML IJ SOLN: 5.0000 mg | Freq: Three times a day (TID) | INTRAMUSCULAR | Status: DC | PRN

## 2016-07-12 MED ORDER — FENTANYL CITRATE (PF) 100 MCG/2ML IJ SOLN
INTRAMUSCULAR | Status: AC
  Filled 2016-07-12: qty 2

## 2016-07-12 MED ORDER — DEXAMETHASONE SODIUM PHOSPHATE 10 MG/ML IJ SOLN
INTRAMUSCULAR | Status: AC
  Filled 2016-07-12: qty 1

## 2016-07-12 MED ORDER — SODIUM CHLORIDE 0.9 % IJ SOLN
INTRAMUSCULAR | Status: DC | PRN
  Administered 2016-07-12: 30 mL

## 2016-07-12 MED ORDER — PHENYLEPHRINE HCL 10 MG/ML IJ SOLN
INTRAMUSCULAR | Status: AC
Start: 2016-07-12 — End: 2016-07-12
  Filled 2016-07-12: qty 1

## 2016-07-12 MED ORDER — SODIUM CHLORIDE 0.9 % IV SOLN
INTRAVENOUS | Status: DC
  Administered 2016-07-12 – 2016-07-13 (×2): via INTRAVENOUS

## 2016-07-12 MED ORDER — BUPIVACAINE LIPOSOME 1.3 % IJ SUSP
INTRAMUSCULAR | Status: DC | PRN
  Administered 2016-07-12: 20 mL

## 2016-07-12 MED ORDER — MIDAZOLAM HCL 5 MG/5ML IJ SOLN
INTRAMUSCULAR | Status: DC | PRN
Start: 2016-07-12 — End: 2016-07-12
  Administered 2016-07-12: 1 mg via INTRAVENOUS

## 2016-07-12 MED ORDER — BUPIVACAINE HCL 0.25 % IJ SOLN
INTRAMUSCULAR | Status: DC | PRN
  Administered 2016-07-12: 20 mL

## 2016-07-12 MED ORDER — ACETAMINOPHEN 500 MG PO TABS
1000.0000 mg | ORAL_TABLET | Freq: Four times a day (QID) | ORAL | Status: AC
  Administered 2016-07-12 – 2016-07-13 (×4): 1000 mg via ORAL
  Filled 2016-07-12 (×4): qty 2

## 2016-07-12 MED ORDER — ACETAMINOPHEN 10 MG/ML IV SOLN
1000.0000 mg | Freq: Once | INTRAVENOUS | Status: AC
  Administered 2016-07-12: 1000 mg via INTRAVENOUS

## 2016-07-12 MED ORDER — BUPIVACAINE-EPINEPHRINE 0.25% -1:200000 IJ SOLN
INTRAMUSCULAR | Status: AC
  Filled 2016-07-12: qty 1

## 2016-07-12 MED ORDER — FENTANYL CITRATE (PF) 100 MCG/2ML IJ SOLN
INTRAMUSCULAR | Status: DC | PRN
  Administered 2016-07-12: 100 ug via INTRAVENOUS

## 2016-07-12 MED ORDER — MENTHOL 3 MG MT LOZG: 1.0000 | LOZENGE | OROMUCOSAL | Status: DC | PRN

## 2016-07-12 MED ORDER — PHENYLEPHRINE HCL 10 MG/ML IJ SOLN
10.0000 mg | INTRAVENOUS | Status: DC | PRN
  Administered 2016-07-12: 30 ug/min via INTRAVENOUS

## 2016-07-12 MED ORDER — BUPIVACAINE HCL (PF) 0.25 % IJ SOLN
INTRAMUSCULAR | Status: AC
  Filled 2016-07-12: qty 30

## 2016-07-12 MED ORDER — LACTATED RINGERS IV SOLN
INTRAVENOUS | Status: DC
  Administered 2016-07-12 (×2): via INTRAVENOUS

## 2016-07-12 MED ORDER — DEXAMETHASONE SODIUM PHOSPHATE 10 MG/ML IJ SOLN
10.0000 mg | Freq: Once | INTRAMUSCULAR | Status: AC
  Administered 2016-07-13: 10 mg via INTRAVENOUS
  Filled 2016-07-12: qty 1

## 2016-07-12 MED ORDER — DEXAMETHASONE SODIUM PHOSPHATE 10 MG/ML IJ SOLN
10.0000 mg | Freq: Once | INTRAMUSCULAR | Status: AC
  Administered 2016-07-12: 10 mg via INTRAVENOUS

## 2016-07-12 MED ORDER — ONDANSETRON HCL 4 MG/2ML IJ SOLN
INTRAMUSCULAR | Status: DC | PRN
  Administered 2016-07-12: 4 mg via INTRAVENOUS

## 2016-07-12 MED ORDER — ACETAMINOPHEN 650 MG RE SUPP: 650.0000 mg | Freq: Four times a day (QID) | RECTAL | Status: DC | PRN

## 2016-07-12 MED ORDER — RIVAROXABAN 10 MG PO TABS
10.0000 mg | ORAL_TABLET | Freq: Every day | ORAL | Status: DC
  Administered 2016-07-13 – 2016-07-14 (×2): 10 mg via ORAL
  Filled 2016-07-12 (×2): qty 1

## 2016-07-12 MED ORDER — DOCUSATE SODIUM 100 MG PO CAPS
100.0000 mg | ORAL_CAPSULE | Freq: Two times a day (BID) | ORAL | Status: DC
  Administered 2016-07-12 – 2016-07-14 (×4): 100 mg via ORAL
  Filled 2016-07-12 (×4): qty 1

## 2016-07-12 MED ORDER — HYDROCHLOROTHIAZIDE 25 MG PO TABS
25.0000 mg | ORAL_TABLET | ORAL | Status: DC
  Administered 2016-07-13 – 2016-07-14 (×2): 25 mg via ORAL
  Filled 2016-07-12 (×2): qty 1

## 2016-07-12 MED ORDER — SODIUM CHLORIDE 0.9 % IR SOLN
Status: DC | PRN
  Administered 2016-07-12: 1000 mL

## 2016-07-12 MED ORDER — OXYCODONE HCL 5 MG PO TABS: 5.0000 mg | ORAL_TABLET | Freq: Once | ORAL | Status: DC | PRN

## 2016-07-12 MED ORDER — MORPHINE SULFATE (PF) 2 MG/ML IV SOLN
1.0000 mg | INTRAVENOUS | Status: DC | PRN
  Administered 2016-07-12 (×2): 1 mg via INTRAVENOUS
  Filled 2016-07-12 (×2): qty 1

## 2016-07-12 MED ORDER — PHENYLEPHRINE 40 MCG/ML (10ML) SYRINGE FOR IV PUSH (FOR BLOOD PRESSURE SUPPORT)
PREFILLED_SYRINGE | INTRAVENOUS | Status: AC
Start: 2016-07-12 — End: 2016-07-12
  Filled 2016-07-12: qty 10

## 2016-07-12 MED ORDER — METHOCARBAMOL 1000 MG/10ML IJ SOLN
500.0000 mg | Freq: Four times a day (QID) | INTRAVENOUS | Status: DC | PRN
  Filled 2016-07-12: qty 5

## 2016-07-12 MED ORDER — LEVOTHYROXINE SODIUM 88 MCG PO TABS
88.0000 ug | ORAL_TABLET | ORAL | Status: DC
  Administered 2016-07-13 – 2016-07-14 (×2): 88 ug via ORAL
  Filled 2016-07-12 (×2): qty 1

## 2016-07-12 MED ORDER — PRAVASTATIN SODIUM 20 MG PO TABS
40.0000 mg | ORAL_TABLET | Freq: Every day | ORAL | Status: DC
  Administered 2016-07-12 – 2016-07-14 (×3): 40 mg via ORAL
  Filled 2016-07-12 (×3): qty 2

## 2016-07-12 MED ORDER — BUPIVACAINE IN DEXTROSE 0.75-8.25 % IT SOLN
INTRATHECAL | Status: DC | PRN
  Administered 2016-07-12: 18 mL via INTRATHECAL

## 2016-07-12 MED ORDER — PROPOFOL 10 MG/ML IV BOLUS
INTRAVENOUS | Status: AC
  Filled 2016-07-12: qty 60

## 2016-07-12 MED ORDER — OXYCODONE HCL 5 MG PO TABS
5.0000 mg | ORAL_TABLET | ORAL | Status: DC | PRN
  Administered 2016-07-12 (×2): 10 mg via ORAL
  Administered 2016-07-12: 5 mg via ORAL
  Administered 2016-07-13 – 2016-07-14 (×10): 10 mg via ORAL
  Filled 2016-07-12 (×3): qty 2
  Filled 2016-07-12: qty 1
  Filled 2016-07-12 (×10): qty 2

## 2016-07-12 MED ORDER — TRANEXAMIC ACID 1000 MG/10ML IV SOLN
2000.0000 mg | Freq: Once | INTRAVENOUS | Status: DC
  Filled 2016-07-12: qty 20

## 2016-07-12 MED ORDER — CHLORHEXIDINE GLUCONATE 4 % EX LIQD: 60.0000 mL | Freq: Once | CUTANEOUS | Status: DC

## 2016-07-12 MED ORDER — SODIUM CHLORIDE 0.9 % IJ SOLN
INTRAMUSCULAR | Status: AC
  Filled 2016-07-12: qty 50

## 2016-07-12 SURGICAL SUPPLY — 54 items
BAG DECANTER FOR FLEXI CONT (MISCELLANEOUS) ×3 IMPLANT
BAG SPEC THK2 15X12 ZIP CLS (MISCELLANEOUS) ×1
BAG ZIPLOCK 12X15 (MISCELLANEOUS) ×3 IMPLANT
BANDAGE ACE 6X5 VEL STRL LF (GAUZE/BANDAGES/DRESSINGS) ×3 IMPLANT
BLADE SAG 18X100X1.27 (BLADE) ×3 IMPLANT
BLADE SAW SGTL 11.0X1.19X90.0M (BLADE) ×3 IMPLANT
BOWL SMART MIX CTS (DISPOSABLE) ×3 IMPLANT
CAP KNEE TOTAL 3 SIGMA ×3 IMPLANT
CEMENT HV SMART SET (Cement) ×6 IMPLANT
CLOSURE WOUND 1/2 X4 (GAUZE/BANDAGES/DRESSINGS) ×2
CLOTH BEACON ORANGE TIMEOUT ST (SAFETY) ×3 IMPLANT
CUFF TOURN SGL QUICK 34 (TOURNIQUET CUFF) ×3
CUFF TRNQT CYL 34X4X40X1 (TOURNIQUET CUFF) ×1 IMPLANT
DECANTER SPIKE VIAL GLASS SM (MISCELLANEOUS) ×3 IMPLANT
DRAPE U-SHAPE 47X51 STRL (DRAPES) ×3 IMPLANT
DRSG ADAPTIC 3X8 NADH LF (GAUZE/BANDAGES/DRESSINGS) ×3 IMPLANT
DRSG PAD ABDOMINAL 8X10 ST (GAUZE/BANDAGES/DRESSINGS) ×3 IMPLANT
DURAPREP 26ML APPLICATOR (WOUND CARE) ×3 IMPLANT
ELECT REM PT RETURN 9FT ADLT (ELECTROSURGICAL) ×3
ELECTRODE REM PT RTRN 9FT ADLT (ELECTROSURGICAL) ×1 IMPLANT
EVACUATOR 1/8 PVC DRAIN (DRAIN) ×3 IMPLANT
GAUZE SPONGE 4X4 12PLY STRL (GAUZE/BANDAGES/DRESSINGS) ×3 IMPLANT
GLOVE BIO SURGEON STRL SZ8 (GLOVE) ×3 IMPLANT
GLOVE BIOGEL PI IND STRL 6.5 (GLOVE) ×1 IMPLANT
GLOVE BIOGEL PI IND STRL 7.0 (GLOVE) IMPLANT
GLOVE BIOGEL PI IND STRL 7.5 (GLOVE) IMPLANT
GLOVE BIOGEL PI IND STRL 8 (GLOVE) ×1 IMPLANT
GLOVE BIOGEL PI INDICATOR 6.5 (GLOVE) ×2
GLOVE BIOGEL PI INDICATOR 7.0 (GLOVE) ×2
GLOVE BIOGEL PI INDICATOR 7.5 (GLOVE) ×2
GLOVE BIOGEL PI INDICATOR 8 (GLOVE) ×2
GLOVE SURG SS PI 6.5 STRL IVOR (GLOVE) ×3 IMPLANT
GLOVE SURG SS PI 7.0 STRL IVOR (GLOVE) ×2 IMPLANT
GLOVE SURG SS PI 7.5 STRL IVOR (GLOVE) ×2 IMPLANT
GOWN STRL REUS W/TWL LRG LVL3 (GOWN DISPOSABLE) ×5 IMPLANT
GOWN STRL REUS W/TWL XL LVL3 (GOWN DISPOSABLE) ×4 IMPLANT
HANDPIECE INTERPULSE COAX TIP (DISPOSABLE) ×3
IMMOBILIZER KNEE 20 (SOFTGOODS) ×2 IMPLANT
MANIFOLD NEPTUNE II (INSTRUMENTS) ×3 IMPLANT
PACK TOTAL KNEE CUSTOM (KITS) ×3 IMPLANT
PAD ABD 8X10 STRL (GAUZE/BANDAGES/DRESSINGS) ×3 IMPLANT
PADDING CAST COTTON 6X4 STRL (CAST SUPPLIES) ×9 IMPLANT
POSITIONER SURGICAL ARM (MISCELLANEOUS) ×3 IMPLANT
SET HNDPC FAN SPRY TIP SCT (DISPOSABLE) ×1 IMPLANT
STRIP CLOSURE SKIN 1/2X4 (GAUZE/BANDAGES/DRESSINGS) ×4 IMPLANT
SUT MNCRL AB 4-0 PS2 18 (SUTURE) ×3 IMPLANT
SUT VIC AB 2-0 CT1 27 (SUTURE) ×9
SUT VIC AB 2-0 CT1 TAPERPNT 27 (SUTURE) ×3 IMPLANT
SUT VIC AB 2-0 CT2 27 (SUTURE) ×2 IMPLANT
SUT VLOC 180 0 24IN GS25 (SUTURE) ×3 IMPLANT
SYR 50ML LL SCALE MARK (SYRINGE) ×3 IMPLANT
TRAY FOLEY W/METER SILVER 14FR (SET/KITS/TRAYS/PACK) ×3 IMPLANT
WRAP KNEE MAXI GEL POST OP (GAUZE/BANDAGES/DRESSINGS) ×3 IMPLANT
YANKAUER SUCT BULB TIP 10FT TU (MISCELLANEOUS) ×3 IMPLANT

## 2016-07-12 NOTE — Evaluation (Signed)
Physical Therapy Evaluation Patient Details Name: [name] MRN: [id number] DOB: 04-04-1935 Today's Date: 07/12/2016   History of Present Illness  pt s/p RTKA with h/o R and L THA in the past (per patietn many years ago, and h/o of 1 which dislocated and re located) .   Clinical Impression  Pt tolerated session well s/p RTKA POD0 today. PT with restless leg syndrome as well, so after getting OOB pt was in less pain. Pt to benefit from PT to continue with strengthening, ROM and gait training in order to transition pt home alone safely.     Follow Up Recommendations Home health PT (unless MD wants OP and/or pt has a ride to OP due to pt home alone )    Equipment Recommendations  None recommended by PT    Recommendations for Other Services       Precautions / Restrictions Precautions Precautions: Knee Required Braces or Orthoses: Knee Immobilizer - Right Knee Immobilizer - Right: Discontinue once straight leg raise with < 10 degree lag Restrictions Weight Bearing Restrictions: No      Mobility  Bed Mobility Overal bed mobility: Needs Assistance Bed Mobility: Sit to Supine;Supine to Sit     Supine to sit: Min guard;HOB elevated     General bed mobility comments: min assistance with R LE, however used raila nd HOB elevated a little, however moved fairly well.   Transfers Overall transfer level: Needs assistance Equipment used: Rolling walker (2 wheeled) Transfers: Sit to/from Stand Sit to Stand: Min guard         General transfer comment: cues for RW safetya dn reminders for hand placment, however moved fairly well for first time up P/O  Ambulation/Gait Ambulation/Gait assistance: Min assist Ambulation Distance (Feet): 30 Feet Assistive device: Rolling walker (2 wheeled) Gait Pattern/deviations: Step-to pattern     General Gait Details: cues for sequencing and to not stay too close to front of RW for balance purposes.   Stairs            Wheelchair  Mobility    Modified Rankin (Stroke Patients Only)       Balance Overall balance assessment: Needs assistance Sitting-balance support: Single extremity supported;Feet supported Sitting balance-Leahy Scale: Normal     Standing balance support: Bilateral upper extremity supported Standing balance-Leahy Scale: Good                               Pertinent Vitals/Pain Pain Assessment: 0-10 Pain Score: 5  Pain Location: R knee  (pt has restless leg syndrome, so she was having more difficulty with that at the moment ) Pain Descriptors / Indicators: Aching Pain Intervention(s): Monitored during session;Premedicated before session;Ice applied    Home Living Family/patient expects to be discharged to:: Private residence Living Arrangements: Alone Available Help at Discharge: Other (Comment) (unsure, pt stated she would have someone check on her, but very vague and not many worries even when asked, so I am unsure . ) Type of Home: House Home Access: Level entry     Home Layout: One level Home Equipment: Walker - 2 wheels      Prior Function Level of Independence: Independent         Comments: (very little description when asked PLOF or DC planning)     Hand Dominance        Extremity/Trunk Assessment               Lower  Extremity Assessment: Overall WFL for tasks assessed;RLE deficits/detail RLE Deficits / Details: unable to do SLR yet, so about 3-/5 for R knee extension at this time, and knee ROM 10-70 in supine in gentle range.        Communication   Communication: No difficulties  Cognition Arousal/Alertness: Awake/alert Behavior During Therapy: WFL for tasks assessed/performed Overall Cognitive Status: Within Functional Limits for tasks assessed                      General Comments      Exercises Total Joint Exercises Ankle Circles/Pumps: Right;10 reps;AROM Quad Sets: AAROM;Right;5 reps (limited on exercises due to pateint  really anxious to get OOB tonight. )      Assessment/Plan    PT Assessment Patient needs continued PT services  PT Diagnosis Difficulty walking;Generalized weakness   PT Problem List Decreased strength;Decreased range of motion;Decreased activity tolerance;Decreased mobility;Decreased knowledge of use of DME  PT Treatment Interventions DME instruction;Gait training;Functional mobility training;Therapeutic activities;Therapeutic exercise;Patient/family education   PT Goals (Current goals can be found in the Care Plan section) Acute Rehab PT Goals Patient Stated Goal: I want to get of of this bed to help my restless leg syndrome  PT Goal Formulation: With patient Time For Goal Achievement: 07/26/16 Potential to Achieve Goals: Good    Frequency 7X/week   Barriers to discharge        Co-evaluation               End of Session Equipment Utilized During Treatment: Gait belt Activity Tolerance: Patient tolerated treatment well (pt felt much better after moving and OOB to recliner) Patient left: in chair;with call bell/phone within reach;with chair alarm set Nurse Communication: Mobility status         Time: [id number] PT Time Calculation (min) (ACUTE ONLY): 14 min   Charges:   PT Evaluation $PT Eval Low Complexity: 1 Procedure     PT G CodesClide Dales 2016/07/25, 5:47 PM  Clide Dales, PT Pager: [phone] July 25, 2016

## 2016-07-12 NOTE — Transfer of Care (Signed)
Immediate Anesthesia Transfer of Care Note  Patient: [name]  Procedure(s) Performed: Procedure(s): RIGHT TOTAL KNEE ARTHROPLASTY (Right)  Patient Location: PACU  Anesthesia Type:Spinal  Level of Consciousness: awake, alert , oriented and patient cooperative  Airway & Oxygen Therapy: Patient Spontanous Breathing and Patient connected to face mask oxygen  Post-op Assessment: Report given to RN and Post -op Vital signs reviewed and stable  Post vital signs: stable  Last Vitals:  Filed Vitals:   07/12/16 0537  BP: 137/74  Pulse: 62  Temp: 36.7 C  Resp: 16    Last Pain: There were no vitals filed for this visit.       Complications: No apparent anesthesia complications   [id number] spinal level

## 2016-07-12 NOTE — Interval H&P Note (Signed)
History and Physical Interval Note:  07/12/2016 6:25 AM  [name]  has presented today for surgery, with the diagnosis of OA RIGHT KNEE  The various methods of treatment have been discussed with the patient and family. After consideration of risks, benefits and other options for treatment, the patient has consented to  Procedure(s): RIGHT TOTAL KNEE ARTHROPLASTY (Right) as a surgical intervention .  The patient's history has been reviewed, patient examined, no change in status, stable for surgery.  I have reviewed the patient's chart and labs.  Questions were answered to the patient's satisfaction.     Gearlean Alf

## 2016-07-12 NOTE — H&P (View-Only) (Signed)
[name] DOB: [birth year] Married / Language: English / Race: White Female Date of Admission:  07/12/16 CC:  Right Knee Pain History of Present Illness  The patient is a 81 year old female who comes in for a preoperative History and Physical. The patient is scheduled for a right total knee arthroplasty to be performed by Dr. Dione Plover. Aluisio, MD at [facility] on 07/12/2016. The patient is a 81 year old female who presented for follow up of their knee. The patient is being followed for their right knee pain and osteoarthritis. They are now over a year out from cortisone injection. Symptoms reported include: pain, swelling, aching, catching and difficulty ambulating. The patient feels that they are doing poorly and report their pain level to be moderate to severe. The following medication has been used for pain control: antiinflammatory medication (Aleve) and Ultram. The patient has not gotten any relief of their symptoms with Cortisone injections. Her right knee has given her progressively more problems. She is hurting at all times now. She will even hurt at night. It occasionally keeps her awake. It is something which she can and cannot do. Injections have not helped much in the past. She would like to get the right knee fixed at this time. They have been treated conservatively in the past for the above stated problem and despite conservative measures, they continue to have progressive pain and severe functional limitations and dysfunction. They have failed non-operative management including home exercise, medications, and injections. It is felt that they would benefit from undergoing total joint replacement. Risks and benefits of the procedure have been discussed with the patient and they elect to proceed with surgery. There are no active contraindications to surgery such as ongoing infection or rapidly progressive neurological disease.  Problem List/Past Medical Dislocation of hip  prosthesis (996.42)  Primary osteoarthritis of one knee, right (M17.11)  Hip discomfort, right (M25.551)  High blood pressure  Chronic Cystitis  Gout  Hypercholesterolemia  Hyperthyroidism  S/P Partial Thyroidectomy Hypothyroidism  Post-surgical Osteoporosis  S/P Dislocation THA  Cataract  Hemorrhoids  Allergies No Known Drug Allergies  Family History  Cancer  Sister. Cerebrovascular Accident  Father. Diabetes Mellitus  Paternal Grandmother. Heart Disease  Father. Hypertension  Father.  Social History Tobacco use  Never smoker. 09/26/2014 Children  1 Current work status  retired Furniture conservator/restorer rarely; does other Living situation  live alone Marital status  widowed Never consumed alcohol  09/26/2014: Never consumed alcohol No history of drug/alcohol rehab  Not under pain contract  Number of flights of stairs before winded  greater than 5 Post-Surgical Plans  Home Advance Directives  Living Will, Healthcare POA  Medication History  Hydrochlorothiazide (25MG  Tablet, Oral daily) Active. Calcitriol (0.25MCG Capsule, Oral daily) Active. Synthroid (88MCG Tablet, Oral daily) Active. ROPINIRole HCl (1MG  Tablet, Oral three times daily, as needed) Active. Calcium Carbonate (600MG  Tablet, Oral three times daily) Active. Lisinopril (20MG  Tablet, Oral daily) Active. Pravastatin Sodium (40MG  Tablet, Oral daily) Active. Omeprazole (20MG  Tablet DR, Oral daily) Active. TraMADol HCl (50MG  Tablet, Oral daily) Active. Aleve (220MG  Tablet, Oral as needed) Active.    Past Surgical History Thyroidectomy; Subtotal  Date: 1969. Total Hip Replacement - Left  Date: 12/2002. Total Hip Replacement  Date: 03/2005. right Heart Cath  Date: 06/2007. YAG Laser Eye Surgery Bilateral  Date: 2010. Lumbar Laminectomy  Date: 01/2011. Cataract Surgery  Date: 01/2012. left Colon Polyp Removal - Colonoscopy  Date: 06/2012. Arthroscopy of Knee   right  Dilation and Curettage of Uterus  Tubal Ligation    Review of Systems (Alezandrew L. Finnian Husted III PA-C; 07/01/2016 3:42 PM) General Not Present- Chills, Fatigue, Fever, Memory Loss, Night Sweats, Weight Gain and Weight Loss. Skin Not Present- Eczema, Hives, Itching, Lesions and Rash. HEENT Not Present- Dentures, Double Vision, Headache, Hearing Loss, Tinnitus and Visual Loss. Respiratory Not Present- Allergies, Chronic Cough, Coughing up blood, Shortness of breath at rest and Shortness of breath with exertion. Cardiovascular Not Present- Chest Pain, Difficulty Breathing Lying Down, Murmur, Palpitations, Racing/skipping heartbeats and Swelling. Gastrointestinal Not Present- Abdominal Pain, Bloody Stool, Constipation, Diarrhea, Difficulty Swallowing, Heartburn, Jaundice, Loss of appetitie, Nausea and Vomiting. Female Genitourinary Not Present- Blood in Urine, Discharge, Flank Pain, Incontinence, Painful Urination, Urgency, Urinary frequency, Urinary Retention, Urinating at Night and Weak urinary stream. Musculoskeletal Present- Joint Pain, Joint Swelling, Morning Stiffness and Muscle Pain. Not Present- Back Pain, Muscle Weakness and Spasms. Neurological Not Present- Blackout spells, Difficulty with balance, Dizziness, Paralysis, Tremor and Weakness. Psychiatric Not Present- Insomnia.  Vitals Weight: 164 lb Height: 63.5in Weight was reported by patient. Height was reported by patient. Body Surface Area: 1.79 m Body Mass Index: 28.6 kg/m  Pulse: 60 (Regular)  BP: 130/78 (Sitting, Right Arm, Standard)   Physical Exam  General Mental Status -Alert, cooperative and good historian. General Appearance-pleasant, Not in acute distress. Orientation-Oriented X3. Build & Nutrition-Well nourished and Well developed.  Head and Neck Head-normocephalic, atraumatic . Neck Global Assessment - supple, no bruit auscultated on the right, no bruit auscultated on the  left.  Eye Pupil - Bilateral-Regular and Round. Motion - Bilateral-EOMI.  Chest and Lung Exam Auscultation Breath sounds - clear at anterior chest wall and clear at posterior chest wall. Adventitious sounds - No Adventitious sounds.  Cardiovascular Auscultation Rhythm - Regular rate and rhythm. Heart Sounds - S1 WNL and S2 WNL. Murmurs & Other Heart Sounds - Auscultation of the heart reveals - No Murmurs.  Abdomen Palpation/Percussion Tenderness - Abdomen is non-tender to palpation. Rigidity (guarding) - Abdomen is soft. Auscultation Auscultation of the abdomen reveals - Bowel sounds normal.  Female Genitourinary Note: Not done, not pertinent to present illness   Musculoskeletal Note: On exam, she is alert and oriented, in no apparent distress. Her right knee shows a valgus deformity. Her right hip has normal range of motion with no discomfort. Her right knee range about 5 to 125 degrees. Has moderate crepitus on range of motion. She is tender anterior and laterally. There is no stability about the knee.  RADIOGRAPHS AP both knees and lateral show bone-on-bone arthritis in the lateral compartment of the right knee with subchondral cystic changes in the medial femoral condyle. She also has patellofemoral arthritis with a lot of spurring.   Assessment & Plan Primary osteoarthritis of one knee, right (M17.11)  Note:Surgical Plans: Right Total Knee Replacement  Disposition: Home  PCP: Dr. Forde Dandy - Patient has been seen preoperatively and felt to be stable for surgery.  Topical TXA - CAD  Anesthesia Issues: None  Signed electronically by Ok Edwards, III PA-C

## 2016-07-12 NOTE — Anesthesia Procedure Notes (Signed)
Spinal Patient location during procedure: OR Staffing Anesthesiologist: Mayrani Khamis Preanesthetic Checklist Completed: patient identified, surgical consent, pre-op evaluation, timeout performed, IV checked, risks and benefits discussed and monitors and equipment checked Spinal Block Patient position: sitting Prep: site prepped and draped and DuraPrep Patient monitoring: heart rate, cardiac monitor, continuous pulse ox and blood pressure Approach: midline Location: L3-4 Injection technique: single-shot Needle Needle type: Sprotte  Needle gauge: 24 G Needle length: 10 cm Assessment Sensory level: T6 Additional Notes Spinal placed midline following several attempts by CRNA with 22g Spinocan right paramedian approach without obtaining CSF.

## 2016-07-12 NOTE — Anesthesia Postprocedure Evaluation (Signed)
Anesthesia Post Note  Patient: [name]  Procedure(s) Performed: Procedure(s) (LRB): RIGHT TOTAL KNEE ARTHROPLASTY (Right)  Patient location during evaluation: PACU Anesthesia Type: Spinal Level of consciousness: awake Pain management: pain level controlled Vital Signs Assessment: post-procedure vital signs reviewed and stable Respiratory status: spontaneous breathing Cardiovascular status: stable Postop Assessment: no signs of nausea or vomiting and spinal receding Anesthetic complications: no    Last Vitals:  Filed Vitals:   07/12/16 1301 07/12/16 1400  BP: 133/65 145/99  Pulse: 60 62  Temp: 36.8 C 36.5 C  Resp: 14 14    Last Pain:  Filed Vitals:   07/12/16 1604  PainSc: 5                  Caleah Tortorelli

## 2016-07-12 NOTE — Anesthesia Preprocedure Evaluation (Signed)
Anesthesia Evaluation  Patient identified by MRN, date of birth, ID band Patient awake    Reviewed: Allergy & Precautions, NPO status , Patient's Chart, lab work & pertinent test results  History of Anesthesia Complications Negative for: history of anesthetic complications  Airway Mallampati: II  TM Distance: >3 FB Neck ROM: Full    Dental  (+) Teeth Intact,    Pulmonary neg pulmonary ROS,    breath sounds clear to auscultation       Cardiovascular hypertension, Pt. on medications (-) angina(-) Past MI and (-) CHF  Rhythm:Regular     Neuro/Psych negative neurological ROS  negative psych ROS   GI/Hepatic Neg liver ROS, GERD  Medicated and Controlled,  Endo/Other  Hypothyroidism   Renal/GU Renal InsufficiencyRenal disease     Musculoskeletal  (+) Arthritis ,   Abdominal   Peds  Hematology   Anesthesia Other Findings   Reproductive/Obstetrics                             Anesthesia Physical Anesthesia Plan  ASA: II  Anesthesia Plan: Spinal   Post-op Pain Management:    Induction:   Airway Management Planned: Natural Airway, Nasal Cannula and Simple Face Mask  Additional Equipment: None  Intra-op Plan:   Post-operative Plan:   Informed Consent: I have reviewed the patients History and Physical, chart, labs and discussed the procedure including the risks, benefits and alternatives for the proposed anesthesia with the patient or authorized representative who has indicated his/her understanding and acceptance.   Dental advisory given  Plan Discussed with: CRNA and Surgeon  Anesthesia Plan Comments:         Anesthesia Quick Evaluation

## 2016-07-12 NOTE — Op Note (Signed)
Pre-operative diagnosis- Osteoarthritis  Right knee(s)  Post-operative diagnosis- Osteoarthritis Right knee(s)  Procedure-  Right  Total Knee Arthroplasty  Surgeon- Rita Plover. Brack Shaddock, MD  Assistant- Ardeen Jourdain, PA-C   Anesthesia-  Spinal  EBL-* No blood loss amount entered *   Drains Hemovac  Tourniquet time-  Total Tourniquet Time Documented: Thigh (Right) - 34 minutes Total: Thigh (Right) - 34 minutes     Complications- None  Condition-PACU - hemodynamically stable.   Brief Clinical Note  [name] is a 81 y.o. year old female with end stage OA of her right knee with progressively worsening pain and dysfunction. She has constant pain, with activity and at rest and significant functional deficits with difficulties even with ADLs. She has had extensive non-op management including analgesics, injections of cortisone and viscosupplements, and home exercise program, but remains in significant pain with significant dysfunction. Radiographs show bone on bone arthritis lateral and patellofemoral. She presents now for right Total Knee Arthroplasty.    Procedure in detail---   The patient is brought into the operating room and positioned supine on the operating table. After successful administration of  Spinal,   a tourniquet is placed high on the  Right thigh(s) and the lower extremity is prepped and draped in the usual sterile fashion. Time out is performed by the operating team and then the  Right lower extremity is wrapped in Esmarch, knee flexed and the tourniquet inflated to 300 mmHg.       A midline incision is made with a ten blade through the subcutaneous tissue to the level of the extensor mechanism. A fresh blade is used to make a medial parapatellar arthrotomy. Soft tissue over the proximal medial tibia is subperiosteally elevated to the joint line with a knife and into the semimembranosus bursa with a Cobb elevator. Soft tissue over the proximal lateral tibia is  elevated with attention being paid to avoiding the patellar tendon on the tibial tubercle. The patella is everted, knee flexed 90 degrees and the ACL and PCL are removed. Findings are bone on bone lateral and patellofemoral with large global osteophytes.        The drill is used to create a starting hole in the distal femur and the canal is thoroughly irrigated with sterile saline to remove the fatty contents. The 5 degree Right  valgus alignment guide is placed into the femoral canal and the distal femoral cutting block is pinned to remove 10 mm off the distal femur. Resection is made with an oscillating saw.      The tibia is subluxed forward and the menisci are removed. The extramedullary alignment guide is placed referencing proximally at the medial aspect of the tibial tubercle and distally along the second metatarsal axis and tibial crest. The block is pinned to remove 45mm off the more deficient lateral  side. Resection is made with an oscillating saw. Size 3is the most appropriate size for the tibia and the proximal tibia is prepared with the modular drill and keel punch for that size.      The femoral sizing guide is placed and size 3 is most appropriate. Rotation is marked off the epicondylar axis and confirmed by creating a rectangular flexion gap at 90 degrees. The size 3 cutting block is pinned in this rotation and the anterior, posterior and chamfer cuts are made with the oscillating saw. The intercondylar block is then placed and that cut is made.      Trial size 3 tibial component,  trial size 3 posterior stabilized femur and a 12.5  mm posterior stabilized rotating platform insert trial is placed. Full extension is achieved with excellent varus/valgus and anterior/posterior balance throughout full range of motion. The patella is everted and thickness measured to be 22  mm. Free hand resection is taken to 12 mm, a 35 template is placed, lug holes are drilled, trial patella is placed, and it tracks  normally. Osteophytes are removed off the posterior femur with the trial in place. All trials are removed and the cut bone surfaces prepared with pulsatile lavage. Cement is mixed and once ready for implantation, the size 3 tibial implant, size  3 posterior stabilized femoral component, and the size 35 patella are cemented in place and the patella is held with the clamp. The trial insert is placed and the knee held in full extension. The Exparel (20 ml mixed with 30 ml saline) and .25% Bupivicaine, are injected into the extensor mechanism, posterior capsule, medial and lateral gutters and subcutaneous tissues.  All extruded cement is removed and once the cement is hard the permanent 12.5 mm posterior stabilized rotating platform insert is placed into the tibial tray.      The wound is copiously irrigated with saline solution and the extensor mechanism closed over a hemovac drain with #1 V-loc suture. The tourniquet is released for a total tourniquet time of 34  minutes. Flexion against gravity is 140 degrees and the patella tracks normally. Subcutaneous tissue is closed with 2.0 vicryl and subcuticular with running 4.0 Monocryl. The incision is cleaned and dried and steri-strips and a bulky sterile dressing are applied. The limb is placed into a knee immobilizer and the patient is awakened and transported to recovery in stable condition.      Please note that a surgical assistant was a medical necessity for this procedure in order to perform it in a safe and expeditious manner. Surgical assistant was necessary to retract the ligaments and vital neurovascular structures to prevent injury to them and also necessary for proper positioning of the limb to allow for anatomic placement of the prosthesis.   Rita Plover Zayla Agar, MD    07/12/2016, 8:11 AM

## 2016-07-13 LAB — BASIC METABOLIC PANEL
ANION GAP: 6 (ref 5–15)
BUN: 28 mg/dL — ABNORMAL HIGH (ref 6–20)
CHLORIDE: 107 mmol/L (ref 101–111)
CO2: 25 mmol/L (ref 22–32)
Calcium: 7.4 mg/dL — ABNORMAL LOW (ref 8.9–10.3)
Creatinine, Ser: 1.04 mg/dL — ABNORMAL HIGH (ref 0.44–1.00)
GFR calc non Af Amer: 50 mL/min — ABNORMAL LOW (ref >60)
GFR, EST AFRICAN AMERICAN: 58 mL/min — AB (ref >60)
GLUCOSE: 132 mg/dL — AB (ref 65–99)
POTASSIUM: 3.8 mmol/L (ref 3.5–5.1)
Sodium: 138 mmol/L (ref 135–145)

## 2016-07-13 LAB — CBC
HEMATOCRIT: 33.9 % — AB (ref 36.0–46.0)
HEMOGLOBIN: 11.4 g/dL — AB (ref 12.0–15.0)
MCH: 31.4 pg (ref 26.0–34.0)
MCHC: 33.6 g/dL (ref 30.0–36.0)
MCV: 93.4 fL (ref 78.0–100.0)
Platelets: 205 10*3/uL (ref 150–400)
RBC: 3.63 MIL/uL — ABNORMAL LOW (ref 3.87–5.11)
RDW: 13.6 % (ref 11.5–15.5)
WBC: 10.7 10*3/uL — AB (ref 4.0–10.5)

## 2016-07-13 MED ORDER — METHOCARBAMOL 500 MG PO TABS: 500.0000 mg | ORAL_TABLET | Freq: Four times a day (QID) | ORAL | Status: DC | PRN

## 2016-07-13 MED ORDER — OMEPRAZOLE 20 MG PO CPDR
20.0000 mg | DELAYED_RELEASE_CAPSULE | Freq: Every day | ORAL | Status: DC
  Administered 2016-07-13 – 2016-07-14 (×2): 20 mg via ORAL
  Filled 2016-07-13 (×2): qty 1

## 2016-07-13 MED ORDER — TRAMADOL HCL 50 MG PO TABS: 50.0000 mg | ORAL_TABLET | Freq: Four times a day (QID) | ORAL | Status: DC | PRN

## 2016-07-13 MED ORDER — ONDANSETRON HCL 4 MG PO TABS: 4.0000 mg | ORAL_TABLET | Freq: Four times a day (QID) | ORAL | Status: DC | PRN

## 2016-07-13 MED ORDER — OXYCODONE HCL 5 MG PO TABS: 5.0000 mg | ORAL_TABLET | ORAL | Status: DC | PRN

## 2016-07-13 MED ORDER — RIVAROXABAN 10 MG PO TABS
10.0000 mg | ORAL_TABLET | Freq: Every day | ORAL | Status: DC
Start: 2016-07-13 — End: 2024-03-06

## 2016-07-13 NOTE — Care Management Note (Signed)
Case Management Note  Patient Details  Name: PLEASANT BRITZ MRN: [id number] Date of Birth: 1935/09/13  Subjective/Objective:                  RIGHT TOTAL KNEE ARTHROPLASTY (Right) Action/Plan: Discharge planning Expected Discharge Date:  07/14/16               Expected Discharge Plan:  [place]  In-House Referral:     Discharge planning Services  CM Consult  Post Acute Care Choice:    Choice offered to:  Patient  DME Arranged:  N/A DME Agency:  NA  HH Arranged:  NA HH Agency:  [facility] (now [facility])  Status of Service:  Completed, signed off  If discussed at [employer], dates discussed:    Additional Comments: Cm met with pt in room to offer choice of home health agency.  Pt chooses [facility] to render HHPT.  Referral called to [facility] rep, Tim. Pt states she has all DME needed at home.  NO other Cm needs were communicated. Dellie Catholic, RN 07/13/2016, 12:36 PM

## 2016-07-13 NOTE — Discharge Instructions (Addendum)
° °Dr. Frank Aluisio °Total Joint Specialist °[facility] °[address] °[place], [place] [address] °[phone] ° °TOTAL KNEE REPLACEMENT POSTOPERATIVE DIRECTIONS ° °Knee Rehabilitation, Guidelines Following Surgery  °Results after knee surgery are often greatly improved when you follow the exercise, range of motion and muscle strengthening exercises prescribed by your doctor. Safety measures are also important to protect the knee from further injury. Any time any of these exercises cause you to have increased pain or swelling in your knee joint, decrease the amount until you are comfortable again and slowly increase them. If you have problems or questions, call your caregiver or physical therapist for advice.  ° °HOME CARE INSTRUCTIONS  °Remove items at home which could result in a fall. This includes throw rugs or furniture in walking pathways.  °· ICE to the affected knee every three hours for 30 minutes at a time and then as needed for pain and swelling.  Continue to use ice on the knee for pain and swelling from surgery. You may notice swelling that will progress down to the foot and ankle.  This is normal after surgery.  Elevate the leg when you are not up walking on it.   °· Continue to use the breathing machine which will help keep your temperature down.  It is common for your temperature to cycle up and down following surgery, especially at night when you are not up moving around and exerting yourself.  The breathing machine keeps your lungs expanded and your temperature down. °· Do not place pillow under knee, focus on keeping the knee straight while resting ° °DIET °You may resume your previous home diet once your are discharged from the hospital. ° °DRESSING / WOUND CARE / SHOWERING °You may shower 3 days after surgery, but keep the wounds dry during showering.  You may use an occlusive plastic wrap (Press'n Seal for example), NO SOAKING/SUBMERGING IN THE BATHTUB.  If the  bandage gets wet, change with a clean dry gauze.  If the incision gets wet, pat the wound dry with a clean towel. °You may start showering once you are discharged home but do not submerge the incision under water. Just pat the incision dry and apply a dry gauze dressing on daily. °Change the surgical dressing daily and reapply a dry dressing each time. ° °ACTIVITY °Walk with your walker as instructed. °Use walker as long as suggested by your caregivers. °Avoid periods of inactivity such as sitting longer than an hour when not asleep. This helps prevent blood clots.  °You may resume a sexual relationship in one month or when given the OK by your doctor.  °You may return to work once you are cleared by your doctor.  °Do not drive a car for 6 weeks or until released by you surgeon.  °Do not drive while taking narcotics. ° °WEIGHT BEARING °Weight bearing as tolerated with assist device (walker, cane, etc) as directed, use it as long as suggested by your surgeon or therapist, typically at least 4-6 weeks. ° °POSTOPERATIVE CONSTIPATION PROTOCOL °Constipation - defined medically as fewer than three stools per week and severe constipation as less than one stool per week. ° °One of the most common issues patients have following surgery is constipation.  Even if you have a regular bowel pattern at home, your normal regimen is likely to be disrupted due to multiple reasons following surgery.  Combination of anesthesia, postoperative narcotics, change in appetite and fluid intake all can affect your bowels.    In order to avoid complications following surgery, here are some recommendations in order to help you during your recovery period. ° °Colace (docusate) - Pick up an over-the-counter form of Colace or another stool softener and take twice a day as long as you are requiring postoperative pain medications.  Take with a full glass of water daily.  If you experience loose stools or diarrhea, hold the colace until you stool forms  back up.  If your symptoms do not get better within 1 week or if they get worse, check with your doctor. ° °Dulcolax (bisacodyl) - Pick up over-the-counter and take as directed by the product packaging as needed to assist with the movement of your bowels.  Take with a full glass of water.  Use this product as needed if not relieved by Colace only.  ° °MiraLax (polyethylene glycol) - Pick up over-the-counter to have on hand.  MiraLax is a solution that will increase the amount of water in your bowels to assist with bowel movements.  Take as directed and can mix with a glass of water, juice, soda, coffee, or tea.  Take if you go more than two days without a movement. °Do not use MiraLax more than once per day. Call your doctor if you are still constipated or irregular after using this medication for 7 days in a row. ° °If you continue to have problems with postoperative constipation, please contact the office for further assistance and recommendations.  If you experience "the worst abdominal pain ever" or develop nausea or vomiting, please contact the office immediatly for further recommendations for treatment. ° °ITCHING ° If you experience itching with your medications, try taking only a single pain pill, or even half a pain pill at a time.  You can also use Benadryl over the counter for itching or also to help with sleep.  ° °TED HOSE STOCKINGS °Wear the elastic stockings on both legs for three weeks following surgery during the day but you may remove then at night for sleeping. ° °MEDICATIONS °See your medication summary on the “After Visit Summary” that the nursing staff will review with you prior to discharge.  You may have some home medications which will be placed on hold until you complete the course of blood thinner medication.  It is important for you to complete the blood thinner medication as prescribed by your surgeon.  Continue your approved medications as instructed at time of  discharge. ° °PRECAUTIONS °If you experience chest pain or shortness of breath - call 911 immediately for transfer to the hospital emergency department.  °If you develop a fever greater that 101 F, purulent drainage from wound, increased redness or drainage from wound, foul odor from the wound/dressing, or calf pain - CONTACT YOUR SURGEON.   °                                                °FOLLOW-UP APPOINTMENTS °Make sure you keep all of your appointments after your operation with your surgeon and caregivers. You should call the office at the above phone number and make an appointment for approximately two weeks after the date of your surgery or on the date instructed by your surgeon outlined in the "After Visit Summary". ° ° °RANGE OF MOTION AND STRENGTHENING EXERCISES  °Rehabilitation of the knee is important following a knee injury or   an operation. After just a few days of immobilization, the muscles of the thigh which control the knee become weakened and shrink (atrophy). Knee exercises are designed to build up the tone and strength of the thigh muscles and to improve knee motion. Often times heat used for twenty to thirty minutes before working out will loosen up your tissues and help with improving the range of motion but do not use heat for the first two weeks following surgery. These exercises can be done on a training (exercise) mat, on the floor, on a table or on a bed. Use what ever works the best and is most comfortable for you Knee exercises include:  °Leg Lifts - While your knee is still immobilized in a splint or cast, you can do straight leg raises. Lift the leg to 60 degrees, hold for 3 sec, and slowly lower the leg. Repeat 10-20 times 2-3 times daily. Perform this exercise against resistance later as your knee gets better.  °Quad and Hamstring Sets - Tighten up the muscle on the front of the thigh (Quad) and hold for 5-10 sec. Repeat this 10-20 times hourly. Hamstring sets are done by pushing the  foot backward against an object and holding for 5-10 sec. Repeat as with quad sets.  °· Leg Slides: Lying on your back, slowly slide your foot toward your buttocks, bending your knee up off the floor (only go as far as is comfortable). Then slowly slide your foot back down until your leg is flat on the floor again. °· Angel Wings: Lying on your back spread your legs to the side as far apart as you can without causing discomfort.  °A rehabilitation program following serious knee injuries can speed recovery and prevent re-injury in the future due to weakened muscles. Contact your doctor or a physical therapist for more information on knee rehabilitation.  ° °IF YOU ARE TRANSFERRED TO A SKILLED REHAB FACILITY °If the patient is transferred to a skilled rehab facility following release from the hospital, a list of the current medications will be sent to the facility for the patient to continue.  When discharged from the skilled rehab facility, please have the facility set up the patient's Home Health Physical Therapy prior to being released. Also, the skilled facility will be responsible for providing the patient with their medications at time of release from the facility to include their pain medication, the muscle relaxants, and their blood thinner medication. If the patient is still at the rehab facility at time of the two week follow up appointment, the skilled rehab facility will also need to assist the patient in arranging follow up appointment in our office and any transportation needs. ° °MAKE SURE YOU:  °Understand these instructions.  °Get help right away if you are not doing well or get worse.  ° ° °Pick up stool softner and laxative for home use following surgery while on pain medications. °Do not submerge incision under water. °Please use good hand washing techniques while changing dressing each day. °May shower starting three days after surgery. °Please use a clean towel to pat the incision dry following  showers. °Continue to use ice for pain and swelling after surgery. °Do not use any lotions or creams on the incision until instructed by your surgeon. ° °Take Xarelto for two and a half more weeks, then discontinue Xarelto. °Once the patient has completed the blood thinner regimen, then take a Baby 81 mg Aspirin daily for three more weeks. ° ° ° °  Information on my medicine - XARELTO (Rivaroxaban)  This medication education was reviewed with me or my healthcare representative as part of my discharge preparation.  The pharmacist that spoke with me during my hospital stay was:  Joycelyn Schmid Hitchins, Stu-PharmD  Why was Xarelto prescribed for you? Xarelto was prescribed for you to reduce the risk of blood clots forming after orthopedic surgery. The medical term for these abnormal blood clots is venous thromboembolism (VTE).  What do you need to know about xarelto ? Take your Xarelto ONCE DAILY at the same time every day. You may take it either with or without food.  If you have difficulty swallowing the tablet whole, you may crush it and mix in applesauce just prior to taking your dose.  Take Xarelto exactly as prescribed by your doctor and DO NOT stop taking Xarelto without talking to the doctor who prescribed the medication.  Stopping without other VTE prevention medication to take the place of Xarelto may increase your risk of developing a clot.  After discharge, you should have regular check-up appointments with your healthcare provider that is prescribing your Xarelto.    What do you do if you miss a dose? If you miss a dose, take it as soon as you remember on the same day then continue your regularly scheduled once daily regimen the next day. Do not take two doses of Xarelto on the same day.   Important Safety Information A possible side effect of Xarelto is bleeding. You should call your healthcare provider right away if you experience any of the following: ? Bleeding from an injury  or your nose that does not stop. ? Unusual colored urine (red or dark brown) or unusual colored stools (red or black). ? Unusual bruising for unknown reasons. ? A serious fall or if you hit your head (even if there is no bleeding).  Some medicines may interact with Xarelto and might increase your risk of bleeding while on Xarelto. To help avoid this, consult your healthcare provider or pharmacist prior to using any new prescription or non-prescription medications, including herbals, vitamins, non-steroidal anti-inflammatory drugs (NSAIDs) and supplements.  This website has more information on Xarelto: [email or web address]

## 2016-07-13 NOTE — Progress Notes (Signed)
Physical Therapy Treatment Note    07/13/16 1500  PT Visit Information  Last PT Received On 07/13/16  Assistance Needed +1  History of Present Illness s/p R TKA  Subjective Data  Subjective Pt ambulated in hallway and assisted back to bed.  Precautions  Precautions Knee;Fall  Pain Assessment  Pain Assessment 0-10  Pain Score 3  Pain Location R knee  Pain Descriptors / Indicators Aching  Pain Intervention(s) Limited activity within patient's tolerance;Monitored during session;Repositioned;Ice applied  Cognition  Arousal/Alertness Awake/alert  Behavior During Therapy WFL for tasks assessed/performed  Overall Cognitive Status Within Functional Limits for tasks assessed  Bed Mobility  Overal bed mobility Needs Assistance  Bed Mobility Sit to Supine;Supine to Sit  Supine to sit Min guard  Sit to supine Min guard  General bed mobility comments pt able to self assist R LE  Transfers  Overall transfer level Needs assistance  Equipment used Rolling walker (2 wheeled)  Transfers Sit to/from Stand  Sit to Stand Min guard  General transfer comment verbal cues for UE and LE positioning  Ambulation/Gait  Ambulation/Gait assistance Min guard;Supervision  Ambulation Distance (Feet) 160 Feet  Assistive device Rolling walker (2 wheeled)  Gait Pattern/deviations Step-through pattern;Antalgic;Decreased step length - left  General Gait Details mobilizing well with RW, cues for knee flexion and heel strike  PT - End of Session  Activity Tolerance Patient tolerated treatment well  Patient left in bed;with call bell/phone within reach  PT - Assessment/Plan  PT Plan Current plan remains appropriate  PT Frequency (ACUTE ONLY) 7X/week  Follow Up Recommendations Home health PT (unless MD wants OP and/or pt has a ride to OP due to pt home)  PT equipment None recommended by PT  PT Goal Progression  Progress towards PT goals Progressing toward goals  PT Time Calculation  PT Start Time (ACUTE  ONLY) 1411  PT Stop Time (ACUTE ONLY) 1424  PT Time Calculation (min) (ACUTE ONLY) 13 min  PT General Charges  $$ ACUTE PT VISIT 1 Procedure  PT Treatments  $Gait Training 8-22 mins   Carmelia Bake, PT, DPT 07/13/2016 Pager: [phone]

## 2016-07-13 NOTE — Discharge Summary (Signed)
Physician Discharge Summary   Patient ID: [name] MRN: [id number] DOB/AGE: [birth year] 81 y.o.  Admit date: 07/12/2016 Discharge date: 07/14/2016  Primary Diagnosis:  Osteoarthritis  Right knee(s)  Admission Diagnoses:  Past Medical History  Diagnosis Date  . Hypertension   . Hyperlipidemia   . Cataract   . RLS (restless legs syndrome)   . Osteopenia   . History of colon polyps     adenomatous  . CAD (coronary artery disease)   . PSVT (paroxysmal supraventricular tachycardia) ([place])   . Thyroid disease     Hypothyroidism  . Vitamin D deficiency   . Blood in stool     saw GI doctor for evaluation  . Arthritis   . GERD (gastroesophageal reflux disease)   . Hypothyroidism    Discharge Diagnoses:   Principal Problem:   OA (osteoarthritis) of knee  Estimated body mass index is 29.06 kg/(m^2) as calculated from the following:   Height as of this encounter: '5\' 3"'  (1.6 m).   Weight as of this encounter: 74.39 kg (164 lb).  Procedure:  Procedure(s) (LRB): RIGHT TOTAL KNEE ARTHROPLASTY (Right)   Consults: None  HPI: [name] is a 81 y.o. year old female with end stage OA of her right knee with progressively worsening pain and dysfunction. She has constant pain, with activity and at rest and significant functional deficits with difficulties even with ADLs. She has had extensive non-op management including analgesics, injections of cortisone and viscosupplements, and home exercise program, but remains in significant pain with significant dysfunction. Radiographs show bone on bone arthritis lateral and patellofemoral. She presents now for right Total Knee Arthroplasty.    Laboratory Data: Admission on 07/12/2016  Component Date Value Ref Range Status  . WBC 07/13/2016 10.7* 4.0 - 10.5 K/uL Final  . RBC 07/13/2016 3.63* 3.87 - 5.11 MIL/uL Final  . Hemoglobin 07/13/2016 11.4* 12.0 - 15.0 g/dL Final  . HCT 07/13/2016 33.9* 36.0 - 46.0 % Final  . MCV 07/13/2016 93.4   78.0 - 100.0 fL Final  . MCH 07/13/2016 31.4  26.0 - 34.0 pg Final  . MCHC 07/13/2016 33.6  30.0 - 36.0 g/dL Final  . RDW 07/13/2016 13.6  11.5 - 15.5 % Final  . Platelets 07/13/2016 205  150 - 400 K/uL Final  . Sodium 07/13/2016 138  135 - 145 mmol/L Final  . Potassium 07/13/2016 3.8  3.5 - 5.1 mmol/L Final  . Chloride 07/13/2016 107  101 - 111 mmol/L Final  . CO2 07/13/2016 25  22 - 32 mmol/L Final  . Glucose, Bld 07/13/2016 132* 65 - 99 mg/dL Final  . BUN 07/13/2016 28* 6 - 20 mg/dL Final  . Creatinine, Ser 07/13/2016 1.04* 0.44 - 1.00 mg/dL Final  . Calcium 07/13/2016 7.4* 8.9 - 10.3 mg/dL Final  . GFR calc non Af Amer 07/13/2016 50* >60 mL/min Final  . GFR calc Af Amer 07/13/2016 58* >60 mL/min Final   Comment: (NOTE) The eGFR has been calculated using the CKD EPI equation. This calculation has not been validated in all clinical situations. eGFR's persistently <60 mL/min signify possible Chronic Kidney Disease.   Georgiann Hahn gap 07/13/2016 6  5 - 15 Final  Hospital Outpatient Visit on 07/05/2016  Component Date Value Ref Range Status  . MRSA, PCR 07/05/2016 NEGATIVE  NEGATIVE Final  . Staphylococcus aureus 07/05/2016 NEGATIVE  NEGATIVE Final   Comment:        The Xpert SA Assay (FDA approved for NASAL specimens in patients  over 82 years of age), is one component of a comprehensive surveillance program.  Test performance has been validated by [facility] for patients greater than or equal to 88 year old. It is not intended to diagnose infection nor to guide or monitor treatment.   Marland Kitchen aPTT 07/05/2016 29  24 - 37 seconds Final  . WBC 07/05/2016 6.3  4.0 - 10.5 K/uL Final  . RBC 07/05/2016 4.57  3.87 - 5.11 MIL/uL Final  . Hemoglobin 07/05/2016 14.2  12.0 - 15.0 g/dL Final  . HCT 07/05/2016 42.7  36.0 - 46.0 % Final  . MCV 07/05/2016 93.4  78.0 - 100.0 fL Final  . MCH 07/05/2016 31.1  26.0 - 34.0 pg Final  . MCHC 07/05/2016 33.3  30.0 - 36.0 g/dL Final  . RDW 07/05/2016  13.6  11.5 - 15.5 % Final  . Platelets 07/05/2016 214  150 - 400 K/uL Final  . Sodium 07/05/2016 138  135 - 145 mmol/L Final  . Potassium 07/05/2016 4.1  3.5 - 5.1 mmol/L Final  . Chloride 07/05/2016 101  101 - 111 mmol/L Final  . CO2 07/05/2016 29  22 - 32 mmol/L Final  . Glucose, Bld 07/05/2016 91  65 - 99 mg/dL Final  . BUN 07/05/2016 28* 6 - 20 mg/dL Final  . Creatinine, Ser 07/05/2016 1.23* 0.44 - 1.00 mg/dL Final  . Calcium 07/05/2016 8.8* 8.9 - 10.3 mg/dL Final  . Total Protein 07/05/2016 7.3  6.5 - 8.1 g/dL Final  . Albumin 07/05/2016 4.1  3.5 - 5.0 g/dL Final  . AST 07/05/2016 23  15 - 41 U/L Final  . ALT 07/05/2016 14  14 - 54 U/L Final  . Alkaline Phosphatase 07/05/2016 89  38 - 126 U/L Final  . Total Bilirubin 07/05/2016 1.1  0.3 - 1.2 mg/dL Final  . GFR calc non Af Amer 07/05/2016 41* >60 mL/min Final  . GFR calc Af Amer 07/05/2016 47* >60 mL/min Final   Comment: (NOTE) The eGFR has been calculated using the CKD EPI equation. This calculation has not been validated in all clinical situations. eGFR's persistently <60 mL/min signify possible Chronic Kidney Disease.   . Anion gap 07/05/2016 8  5 - 15 Final  . Prothrombin Time 07/05/2016 13.7  11.6 - 15.2 seconds Final  . INR 07/05/2016 1.03  0.00 - 1.49 Final  . ABO/RH(D) 07/05/2016 O NEG   Final  . Antibody Screen 07/05/2016 NEG   Final  . Sample Expiration 07/05/2016 07/15/2016   Final  . Extend sample reason 07/05/2016 NO TRANSFUSIONS OR PREGNANCY IN THE PAST 3 MONTHS   Final  . Color, Urine 07/05/2016 YELLOW  YELLOW Final  . APPearance 07/05/2016 CLEAR  CLEAR Final  . Specific Gravity, Urine 07/05/2016 1.008  1.005 - 1.030 Final  . pH 07/05/2016 6.5  5.0 - 8.0 Final  . Glucose, UA 07/05/2016 NEGATIVE  NEGATIVE mg/dL Final  . Hgb urine dipstick 07/05/2016 NEGATIVE  NEGATIVE Final  . Bilirubin Urine 07/05/2016 NEGATIVE  NEGATIVE Final  . Ketones, ur 07/05/2016 NEGATIVE  NEGATIVE mg/dL Final  . Protein, ur 07/05/2016  NEGATIVE  NEGATIVE mg/dL Final  . Nitrite 07/05/2016 NEGATIVE  NEGATIVE Final  . Leukocytes, UA 07/05/2016 NEGATIVE  NEGATIVE Final   MICROSCOPIC NOT DONE ON URINES WITH NEGATIVE PROTEIN, BLOOD, LEUKOCYTES, NITRITE, OR GLUCOSE <1000 mg/dL.  . ABO/RH(D) 07/05/2016 O NEG   Final     X-Rays:No results found.  EKG: Orders placed or performed during the hospital encounter of 07/05/16  .  EKG 12 lead  . EKG 12 lead     Hospital Course: RILEI KRAVITZ is a 81 y.o. who was admitted to [facility]. They were brought to the operating room on 07/12/2016 and underwent Procedure(s): RIGHT TOTAL KNEE ARTHROPLASTY.  Patient tolerated the procedure well and was later transferred to the recovery room and then to the orthopaedic floor for postoperative care.  They were given PO and IV analgesics for pain control following their surgery.  They were given 24 hours of postoperative antibiotics of  Anti-infectives    Start     Dose/Rate Route Frequency Ordered Stop   07/12/16 1400  ceFAZolin (ANCEF) IVPB 2g/100 mL premix     2 g 200 mL/hr over 30 Minutes Intravenous Every 6 hours 07/12/16 1103 07/12/16 2005   07/12/16 0615  ceFAZolin (ANCEF) IVPB 2g/100 mL premix     2 g 200 mL/hr over 30 Minutes Intravenous On call to O.R. 07/12/16 0559 07/12/16 0710     and started on DVT prophylaxis in the form of Xarelto.   PT and OT were ordered for total joint protocol.  Discharge planning consulted to help with postop disposition and equipment needs.  Patient had a decent night on the evening of surgery.  They started to get up OOB with therapy on day one. Hemovac drain was pulled without difficulty.  Continued to work with therapy into day two.  Dressing was changed on day two and the incision was healing well.  Patient was seen in rounds and was ready to go home.  Diet - Cardiac diet Follow up - in 2 weeks Activity - WBAT Disposition - Home Condition Upon Discharge - Good D/C Meds - See DC Summary DVT  Prophylaxis - Xarelto  Discharge Instructions    Call MD / Call 911    Complete by:  As directed   If you experience chest pain or shortness of breath, CALL 911 and be transported to the hospital emergency room.  If you develope a fever above 101 F, pus (white drainage) or increased drainage or redness at the wound, or calf pain, call your surgeon's office.     Change dressing    Complete by:  As directed   Change dressing daily with sterile 4 x 4 inch gauze dressing and apply TED hose. Do not submerge the incision under water.     Constipation Prevention    Complete by:  As directed   Drink plenty of fluids.  Prune juice may be helpful.  You may use a stool softener, such as Colace (over the counter) 100 mg twice a day.  Use MiraLax (over the counter) for constipation as needed.     Diet - low sodium heart healthy    Complete by:  As directed      Discharge instructions    Complete by:  As directed   Pick up stool softner and laxative for home use following surgery while on pain medications. Do not submerge incision under water. Please use good hand washing techniques while changing dressing each day. May shower starting three days after surgery. Please use a clean towel to pat the incision dry following showers. Continue to use ice for pain and swelling after surgery. Do not use any lotions or creams on the incision until instructed by your surgeon.  Take Xarelto for two and a half more weeks, then discontinue Xarelto. Once the patient has completed the Xarelto, they may resume the 81 mg Aspirin.  Postoperative Constipation  Protocol  Constipation - defined medically as fewer than three stools per week and severe constipation as less than one stool per week.  One of the most common issues patients have following surgery is constipation.  Even if you have a regular bowel pattern at home, your normal regimen is likely to be disrupted due to multiple reasons following surgery.   Combination of anesthesia, postoperative narcotics, change in appetite and fluid intake all can affect your bowels.  In order to avoid complications following surgery, here are some recommendations in order to help you during your recovery period.  Colace (docusate) - Pick up an over-the-counter form of Colace or another stool softener and take twice a day as long as you are requiring postoperative pain medications.  Take with a full glass of water daily.  If you experience loose stools or diarrhea, hold the colace until you stool forms back up.  If your symptoms do not get better within 1 week or if they get worse, check with your doctor.  Dulcolax (bisacodyl) - Pick up over-the-counter and take as directed by the product packaging as needed to assist with the movement of your bowels.  Take with a full glass of water.  Use this product as needed if not relieved by Colace only.   MiraLax (polyethylene glycol) - Pick up over-the-counter to have on hand.  MiraLax is a solution that will increase the amount of water in your bowels to assist with bowel movements.  Take as directed and can mix with a glass of water, juice, soda, coffee, or tea.  Take if you go more than two days without a movement. Do not use MiraLax more than once per day. Call your doctor if you are still constipated or irregular after using this medication for 7 days in a row.  If you continue to have problems with postoperative constipation, please contact the office for further assistance and recommendations.  If you experience "the worst abdominal pain ever" or develop nausea or vomiting, please contact the office immediatly for further recommendations for treatment.     Do not put a pillow under the knee. Place it under the heel.    Complete by:  As directed      Do not sit on low chairs, stoools or toilet seats, as it may be difficult to get up from low surfaces    Complete by:  As directed      Driving restrictions    Complete by:   As directed   No driving until released by the physician.     Increase activity slowly as tolerated    Complete by:  As directed      Lifting restrictions    Complete by:  As directed   No lifting until released by the physician.     Patient may shower    Complete by:  As directed   You may shower without a dressing once there is no drainage.  Do not wash over the wound.  If drainage remains, do not shower until drainage stops.     TED hose    Complete by:  As directed   Use stockings (TED hose) for 3 weeks on both leg(s).  You may remove them at night for sleeping.     Weight bearing as tolerated    Complete by:  As directed   Laterality:  right  Extremity:  Lower            Medication List  STOP taking these medications        ALEVE 220 MG Caps  Generic drug:  Naproxen Sodium     aspirin 81 MG tablet     azithromycin 250 MG tablet  Commonly known as:  ZITHROMAX Z-PAK     calcitRIOL 0.25 MCG capsule  Commonly known as:  ROCALTROL     calcium carbonate 600 MG Tabs tablet  Commonly known as:  OS-CAL     conjugated estrogens vaginal cream  Commonly known as:  PREMARIN      TAKE these medications        hydrochlorothiazide 25 MG tablet  Commonly known as:  HYDRODIURIL  Take 25 mg by mouth every morning.     levothyroxine 88 MCG tablet  Commonly known as:  SYNTHROID, LEVOTHROID  Take 88 mcg by mouth every morning.     lisinopril 20 MG tablet  Commonly known as:  PRINIVIL,ZESTRIL  Take 20 mg by mouth every morning.     methocarbamol 500 MG tablet  Commonly known as:  ROBAXIN  Take 1 tablet (500 mg total) by mouth every 6 (six) hours as needed for muscle spasms.     omeprazole 20 MG capsule  Commonly known as:  PRILOSEC  Take 1 capsule (20 mg total) by mouth daily.     ondansetron 4 MG tablet  Commonly known as:  ZOFRAN  Take 1 tablet (4 mg total) by mouth every 6 (six) hours as needed for nausea.     oxyCODONE 5 MG immediate release tablet    Commonly known as:  Oxy IR/ROXICODONE  Take 1-2 tablets (5-10 mg total) by mouth every 3 (three) hours as needed for moderate pain or severe pain.     pravastatin 40 MG tablet  Commonly known as:  PRAVACHOL  Take 40 mg by mouth every morning.     rivaroxaban 10 MG Tabs tablet  Commonly known as:  XARELTO  Take 1 tablet (10 mg total) by mouth daily with breakfast. Take Xarelto for two and a half more weeks, then discontinue Xarelto. Once the patient has completed the Xarelto, they may resume the 81 mg Aspirin.     rOPINIRole 1 MG tablet  Commonly known as:  REQUIP  Take 1 mg by mouth 3 (three) times daily as needed (resltess elgs).     traMADol 50 MG tablet  Commonly known as:  ULTRAM  Take 1-2 tablets (50-100 mg total) by mouth every 6 (six) hours as needed (mild pain).           Follow-up Information    Follow up with [facility].   Why:  home health physical therapy   Contact information:   [place] [id number] [phone]       Follow up with Gearlean Alf, MD. Schedule an appointment as soon as possible for a visit on 07/27/2016.   Specialty:  Orthopedic Surgery   Why:  Call office for appointment for Tuesday 07/27/2016 with Dr. Wynelle Link.   Contact information:   [address] [place] [address] [phone]       Signed: Arlee Muslim, PA-C Orthopaedic Surgery 07/13/2016, 10:25 PM

## 2016-07-13 NOTE — Progress Notes (Signed)
Physical Therapy Treatment Patient Details Name: [name] MRN: [id number] DOB: 03-24-1935 Today's Date: 07/13/2016    History of Present Illness s/p R TKA    PT Comments    Good progress with mobility, pt ambulated 120' with RW and performed TKA exercises with min A. Able to do SLR independently today so DC'd KI, no buckling with ambulation.   Follow Up Recommendations  Home health PT (unless MD wants OP and/or pt has a ride to OP due to pt home alone )     Equipment Recommendations  None recommended by PT    Recommendations for Other Services       Precautions / Restrictions Precautions Precautions: Knee;Fall Required Braces or Orthoses: Knee Immobilizer - Right Knee Immobilizer - Right: Discontinue once straight leg raise with < 10 degree lag (pt able to do SLR today) Restrictions Weight Bearing Restrictions: No    Mobility  Bed Mobility         Supine to sit: Min assist     General bed mobility comments: up in recliner  Transfers   Equipment used: Rolling walker (2 wheeled) Transfers: Sit to/from Stand Sit to Stand: Min guard         General transfer comment: min/guard safety, VCs UE/LE placement  Ambulation/Gait Ambulation/Gait assistance: Supervision Ambulation Distance (Feet): 120 Feet Assistive device: Rolling walker (2 wheeled) Gait Pattern/deviations: Step-to pattern   Gait velocity interpretation: Below normal speed for age/gender General Gait Details: good sequencing, no LOB, no buckling without KI   Financial trader Rankin (Stroke Patients Only)       Balance     Sitting balance-Leahy Scale: Normal       Standing balance-Leahy Scale: Good                      Cognition Arousal/Alertness: Awake/alert Behavior During Therapy: WFL for tasks assessed/performed Overall Cognitive Status: Within Functional Limits for tasks assessed                      Exercises  Total Joint Exercises Ankle Circles/Pumps: Right;10 reps;AROM Quad Sets: Right;AROM;10 reps (limited on exercises due to pateint really anxious to get OOB tonight. ) Short Arc QuadSinclair Ship;Right;10 reps;Supine Heel Slides: AAROM;Right;10 reps;Supine Hip ABduction/ADduction: AAROM;Right;10 reps;Supine Straight Leg Raises: AAROM;AROM;Right;10 reps Goniometric ROM: 10-45* AAROM R knee    General Comments        Pertinent Vitals/Pain Pain Assessment: Faces Pain Score: 6  Faces Pain Scale: Hurts little more Pain Location: R knee with activity Pain Descriptors / Indicators: Aching Pain Intervention(s): Limited activity within patient's tolerance;Monitored during session;Premedicated before session;Ice applied    Home Living Family/patient expects to be discharged to:: Private residence Living Arrangements: Alone           Home Equipment: Bedside commode;Shower seat Additional Comments: AE at home    Prior Function Level of Independence: Independent          PT Goals (current goals can now be found in the care plan section) Acute Rehab PT Goals Patient Stated Goal: get back to being independent, do yardwork PT Goal Formulation: With patient Time For Goal Achievement: 07/26/16 Potential to Achieve Goals: Good Progress towards PT goals: Progressing toward goals    Frequency  7X/week    PT Plan Current plan remains appropriate    Co-evaluation  End of Session Equipment Utilized During Treatment: Gait belt Activity Tolerance: Patient tolerated treatment well (pt felt much better after moving and OOB to recliner) Patient left: in chair;with call bell/phone within reach;with chair alarm set     Time: [id number] PT Time Calculation (min) (ACUTE ONLY): 29 min  Charges:  $Gait Training: 8-22 mins $Therapeutic Exercise: 8-22 mins                    G Codes:      [name] 07/13/2016, 8:54 AM [phone]

## 2016-07-13 NOTE — Evaluation (Signed)
Occupational Therapy Evaluation Patient Details Name: [name] MRN: [id number] DOB: 03-21-1935 Today's Date: 07/13/2016    History of Present Illness s/p R TKA   Clinical Impression   This 81 year old female was admitted for the above surgery. Will follow in acute with supervision level goals. She was independent prior to admission    Follow Up Recommendations  Supervision/Assistance - 24 hour (initially)    Equipment Recommendations  None recommended by OT    Recommendations for Other Services       Precautions / Restrictions Precautions Precautions: Knee;Fall Required Braces or Orthoses: Knee Immobilizer - Right Restrictions Weight Bearing Restrictions: No      Mobility Bed Mobility         Supine to sit: Min assist     General bed mobility comments: for RLE; HOB raised  Transfers   Equipment used: Rolling walker (2 wheeled) Transfers: Sit to/from Stand Sit to Stand: Min guard         General transfer comment: cues for UE/LE placement    Balance                                            ADL Overall ADL's : Needs assistance/impaired     Grooming: Oral care;Set up;Sitting       Lower Body Bathing: Min guard;Sit to/from stand;With adaptive equipment       Lower Body Dressing: Minimal assistance;With adaptive equipment;Sit to/from stand   Toilet Transfer: Min guard;Stand-pivot (chair)             General ADL Comments: pt is able to perform UB adls with set up.  She will have initial 24/7 then intermittent assistance but wants to be as independent as possible. Educated on [employer] and Building services engineer      Pertinent Vitals/Pain Pain Assessment: Faces Faces Pain Scale: Hurts little more Pain Location: R knee Pain Descriptors / Indicators: Aching Pain Intervention(s): Limited activity within patient's tolerance;Monitored during session;Ice applied;Repositioned;Premedicated  before session     Hand Dominance     Extremity/Trunk Assessment Upper Extremity Assessment Upper Extremity Assessment: Overall WFL for tasks assessed           Communication Communication Communication: No difficulties   Cognition Arousal/Alertness: Awake/alert Behavior During Therapy: WFL for tasks assessed/performed Overall Cognitive Status: Within Functional Limits for tasks assessed                     General Comments       Exercises       Shoulder Instructions      Home Living Family/patient expects to be discharged to:: Private residence Living Arrangements: Alone                 Bathroom Shower/Tub: Occupational psychologist: Standard     Home Equipment: Bedside commode;Shower seat   Additional Comments: AE at home      Prior Functioning/Environment Level of Independence: Independent             OT Diagnosis: Acute pain   OT Problem List: Pain;Decreased knowledge of use of DME or AE;Decreased activity tolerance   OT Treatment/Interventions: Self-care/ADL training;DME and/or AE instruction;Patient/family education    OT Goals(Current goals can be found in the care plan section) Acute Rehab OT  Goals Patient Stated Goal: get back to being independent OT Goal Formulation: With patient Time For Goal Achievement: 07/20/16 Potential to Achieve Goals: Good ADL Goals Pt Will Transfer to Toilet: with supervision;ambulating;bedside commode Pt Will Perform Tub/Shower Transfer: Shower transfer;with min guard assist;ambulating;shower seat Additional ADL Goal #1: pt will don KI with set up and leg lifter as needed  Additional ADL Goal #2: pt will perform LB adls with setup sit to stand  OT Frequency: Min 2X/week   Barriers to D/C:            Co-evaluation              End of Session    Activity Tolerance: Patient tolerated treatment well Patient left: in chair;with call bell/phone within reach;with chair alarm set    Time: [id number] OT Time Calculation (min): 23 min Charges:  OT General Charges $OT Visit: 1 Procedure OT Evaluation $OT Eval Low Complexity: 1 Procedure G-Codes:    Gurkaran Rahm 2016/07/23, 8:09 AM Lesle Chris, OTR/L [phone] 07/23/16

## 2016-07-13 NOTE — Progress Notes (Signed)
   Subjective: 1 Day Post-Op Procedure(s) (LRB): RIGHT TOTAL KNEE ARTHROPLASTY (Right) Patient reports pain as mild.   Patient seen in rounds with Dr. Wynelle Link.  Walked 30 feet day of surgery. Patient is well, but has had some minor complaints of pain in the knee, requiring pain medications We will resume therapy today.  Plan is to go Home after hospital stay.  Objective: Vital signs in last 24 hours: Temp:  [96.2 F (35.7 C)-98.3 F (36.8 C)] 97.5 F (36.4 C) (07/18 0624) Pulse Rate:  [48-66] 51 (07/18 0624) Resp:  [12-18] 16 (07/18 0624) BP: (102-164)/(50-99) 113/83 mmHg (07/18 0624) SpO2:  [95 %-100 %] 95 % (07/18 0624) Weight:  [74.39 kg (164 lb)] 74.39 kg (164 lb) (07/17 1100)  Intake/Output from previous day:  Intake/Output Summary (Last 24 hours) at 07/13/16 0834 Last data filed at 07/13/16 [id number]  Gross per 24 hour  Intake 2881.25 ml  Output   2705 ml  Net 176.25 ml    Intake/Output this shift: UOP 550  Labs:  Recent Labs  07/13/16 0411  HGB 11.4*    Recent Labs  07/13/16 0411  WBC 10.7*  RBC 3.63*  HCT 33.9*  PLT 205    Recent Labs  07/13/16 0411  NA 138  K 3.8  CL 107  CO2 25  BUN 28*  CREATININE 1.04*  GLUCOSE 132*  CALCIUM 7.4*   No results for input(s): LABPT, INR in the last 72 hours.  EXAM General - Patient is Alert, Appropriate and Oriented Extremity - Neurovascular intact Sensation intact distally Dorsiflexion/Plantar flexion intact Dressing - dressing C/D/I Motor Function - intact, moving foot and toes well on exam.  Hemovac pulled without difficulty.  Past Medical History  Diagnosis Date  . Hypertension   . Hyperlipidemia   . Cataract   . RLS (restless legs syndrome)   . Osteopenia   . History of colon polyps     adenomatous  . CAD (coronary artery disease)   . PSVT (paroxysmal supraventricular tachycardia) ([place])   . Thyroid disease     Hypothyroidism  . Vitamin D deficiency   . Blood in stool     saw GI doctor  for evaluation  . Arthritis   . GERD (gastroesophageal reflux disease)   . Hypothyroidism     Assessment/Plan: 1 Day Post-Op Procedure(s) (LRB): RIGHT TOTAL KNEE ARTHROPLASTY (Right) Principal Problem:   OA (osteoarthritis) of knee  Estimated body mass index is 29.06 kg/(m^2) as calculated from the following:   Height as of this encounter: 5\' 3"  (1.6 m).   Weight as of this encounter: 74.39 kg (164 lb). Advance diet Up with therapy Plan for discharge tomorrow Discharge home with home health  DVT Prophylaxis - Xarelto 10 mg Weight-Bearing as tolerated to right leg D/C O2 and Pulse OX and try on Room Air  Arlee Muslim, PA-C Orthopaedic Surgery 07/13/2016, 8:34 AM

## 2016-07-14 LAB — BASIC METABOLIC PANEL
Anion gap: 5 (ref 5–15)
BUN: 23 mg/dL — ABNORMAL HIGH (ref 6–20)
CALCIUM: 7.5 mg/dL — AB (ref 8.9–10.3)
CHLORIDE: 107 mmol/L (ref 101–111)
CO2: 28 mmol/L (ref 22–32)
Creatinine, Ser: 1.19 mg/dL — ABNORMAL HIGH (ref 0.44–1.00)
GFR calc non Af Amer: 43 mL/min — ABNORMAL LOW (ref >60)
GFR, EST AFRICAN AMERICAN: 49 mL/min — AB (ref >60)
Glucose, Bld: 141 mg/dL — ABNORMAL HIGH (ref 65–99)
POTASSIUM: 3.6 mmol/L (ref 3.5–5.1)
SODIUM: 140 mmol/L (ref 135–145)

## 2016-07-14 LAB — CBC
HCT: 32.9 % — ABNORMAL LOW (ref 36.0–46.0)
HEMOGLOBIN: 11 g/dL — AB (ref 12.0–15.0)
MCH: 31.2 pg (ref 26.0–34.0)
MCHC: 33.4 g/dL (ref 30.0–36.0)
MCV: 93.2 fL (ref 78.0–100.0)
Platelets: 201 10*3/uL (ref 150–400)
RBC: 3.53 MIL/uL — AB (ref 3.87–5.11)
RDW: 14 % (ref 11.5–15.5)
WBC: 13.7 10*3/uL — ABNORMAL HIGH (ref 4.0–10.5)

## 2016-07-14 NOTE — Progress Notes (Signed)
Occupational Therapy Treatment Patient Details Name: [name] MRN: [id number] DOB: October 08, 1935 Today's Date: 07/14/2016    History of present illness s/p R TKA   OT comments  Pt near goal level:  Will not need further OT.  All education completed.  Follow Up Recommendations  Supervision/Assistance - 24 hour (initial)    Equipment Recommendations  None recommended by OT    Recommendations for Other Services      Precautions / Restrictions Precautions Precautions: Knee;Fall Restrictions Weight Bearing Restrictions: No Other Position/Activity Restrictions: wbat       Mobility Bed Mobility         Supine to sit: Supervision     General bed mobility comments: up in recliner  Transfers Overall transfer level: Modified independent Equipment used: Rolling walker (2 wheeled) Transfers: Sit to/from Stand Sit to Stand: Modified independent (Device/Increase time)         General transfer comment: uses armrests to push up    Balance     Sitting balance-Leahy Scale: Normal       Standing balance-Leahy Scale: Good                     ADL Overall ADL's : Needs assistance/impaired                     Lower Body Dressing: Supervision/safety;With adaptive equipment;Sit to/from stand (underwear and pants with reacher)   Toilet Transfer: Min guard;Ambulation;BSC;RW       Tub/ Shower Transfer: Min guard;Walk-in shower;Ambulation     General ADL Comments: pt no longer needs KI per PT notes      Vision                     Perception     Praxis      Cognition   Behavior During Therapy: WFL for tasks assessed/performed Overall Cognitive Status: Within Functional Limits for tasks assessed                       Extremity/Trunk Assessment               Exercises    Shoulder Instructions       General Comments      Pertinent Vitals/ Pain       Pain Score: 2  Pain Location: R knee Pain Descriptors /  Indicators: Sore Pain Intervention(s): Limited activity within patient's tolerance;Monitored during session;Premedicated before session;Ice applied  Home Living                                          Prior Functioning/Environment              Frequency       Progress Toward Goals  OT Goals(current goals can now be found in the care plan section)  Progress towards OT goals:  (pt near goal level:  will not need further OT)  Acute Rehab OT Goals Patient Stated Goal: get back to being independent, do yardwork  Plan      Co-evaluation                 End of Session     Activity Tolerance Patient tolerated treatment well   Patient Left  (on commode:  NT and RN notified, pt said she would be awhile)   Nurse Communication  Time: [id number] OT Time Calculation (min): 26 min  Charges: OT General Charges $OT Visit: 1 Procedure OT Treatments $Self Care/Home Management : 23-37 mins  Kinzee Happel 07/14/2016, 9:11 AM  Lesle Chris, OTR/L [phone] 07/14/2016

## 2016-07-14 NOTE — Progress Notes (Signed)
   Subjective: 2 Days Post-Op Procedure(s) (LRB): RIGHT TOTAL KNEE ARTHROPLASTY (Right) Patient reports pain as mild.   Patient seen in rounds with Dr. Wynelle Link. Patient is well, and has had no acute complaints or problems Patient is ready to go home  Objective: Vital signs in last 24 hours: Temp:  [97.6 F (36.4 C)-98 F (36.7 C)] 97.7 F (36.5 C) (07/19 0529) Pulse Rate:  [52-65] 60 (07/19 0529) Resp:  [16-18] 16 (07/19 0529) BP: (126-158)/(62-76) 146/69 mmHg (07/19 0529) SpO2:  [93 %-97 %] 93 % (07/19 0529)  Intake/Output from previous day:  Intake/Output Summary (Last 24 hours) at 07/14/16 0846 Last data filed at 07/14/16 0807  Gross per 24 hour  Intake 1693.5 ml  Output   1650 ml  Net   43.5 ml    Intake/Output this shift: Total I/O In: 240 [P.O.:240] Out: -   Labs:  Recent Labs  07/13/16 0411 07/14/16 0443  HGB 11.4* 11.0*    Recent Labs  07/13/16 0411 07/14/16 0443  WBC 10.7* 13.7*  RBC 3.63* 3.53*  HCT 33.9* 32.9*  PLT 205 201    Recent Labs  07/13/16 0411 07/14/16 0443  NA 138 140  K 3.8 3.6  CL 107 107  CO2 25 28  BUN 28* 23*  CREATININE 1.04* 1.19*  GLUCOSE 132* 141*  CALCIUM 7.4* 7.5*   No results for input(s): LABPT, INR in the last 72 hours.  EXAM: General - Patient is Alert, Appropriate and Oriented Extremity - Neurovascular intact Sensation intact distally Dorsiflexion/Plantar flexion intact Incision - clean, dry, no drainage Motor Function - intact, moving foot and toes well on exam.   Assessment/Plan: 2 Days Post-Op Procedure(s) (LRB): RIGHT TOTAL KNEE ARTHROPLASTY (Right) Procedure(s) (LRB): RIGHT TOTAL KNEE ARTHROPLASTY (Right) Past Medical History  Diagnosis Date  . Hypertension   . Hyperlipidemia   . Cataract   . RLS (restless legs syndrome)   . Osteopenia   . History of colon polyps     adenomatous  . CAD (coronary artery disease)   . PSVT (paroxysmal supraventricular tachycardia) ([place])   . Thyroid  disease     Hypothyroidism  . Vitamin D deficiency   . Blood in stool     saw GI doctor for evaluation  . Arthritis   . GERD (gastroesophageal reflux disease)   . Hypothyroidism    Principal Problem:   OA (osteoarthritis) of knee  Estimated body mass index is 29.06 kg/(m^2) as calculated from the following:   Height as of this encounter: 5\' 3"  (1.6 m).   Weight as of this encounter: 74.39 kg (164 lb).  Diet - Cardiac diet Follow up - in 2 weeks Activity - WBAT Disposition - Home Condition Upon Discharge - Good D/C Meds - See DC Summary DVT Prophylaxis - Xarelto  Arlee Muslim, PA-C Orthopaedic Surgery 07/14/2016, 8:46 AM

## 2016-07-14 NOTE — Progress Notes (Signed)
Physical Therapy Treatment Patient Details Name: [name] MRN: [id number] DOB: July 08, 1935 Today's Date: 07/14/2016    History of Present Illness s/p R TKA    PT Comments    Pt progressing well with mobility, she has met PT goals and is ready to DC home from PT standpoint.   Follow Up Recommendations  Home health PT (unless MD wants OP and/or pt has a ride to OP due to pt home alone )     Equipment Recommendations  None recommended by PT    Recommendations for Other Services       Precautions / Restrictions Precautions Precautions: Knee;Fall Restrictions Weight Bearing Restrictions: No Other Position/Activity Restrictions: wbat    Mobility  Bed Mobility         Supine to sit: Supervision     General bed mobility comments: up in recliner  Transfers Overall transfer level: Modified independent Equipment used: Rolling walker (2 wheeled) Transfers: Sit to/from Stand Sit to Stand: Modified independent (Device/Increase time)         General transfer comment: uses armrests to push up  Ambulation/Gait Ambulation/Gait assistance: Supervision Ambulation Distance (Feet): 160 Feet Assistive device: Rolling walker (2 wheeled) Gait Pattern/deviations: Step-to pattern   Gait velocity interpretation: Below normal speed for age/gender General Gait Details: VCs for heel strike and knee flexion with R swing phase, no LOB   Stairs            Wheelchair Mobility    Modified Rankin (Stroke Patients Only)       Balance     Sitting balance-Leahy Scale: Normal       Standing balance-Leahy Scale: Good                      Cognition Arousal/Alertness: Awake/alert Behavior During Therapy: WFL for tasks assessed/performed Overall Cognitive Status: Within Functional Limits for tasks assessed                      Exercises Total Joint Exercises Ankle Circles/Pumps: Right;10 reps;AROM Quad Sets: Right;AROM;10 reps (limited on  exercises due to pateint really anxious to get OOB tonight. ) Short Arc QuadSinclair Ship;Right;10 reps;Supine Heel Slides: AAROM;Right;10 reps;Supine Hip ABduction/ADduction: AAROM;Right;10 reps;Supine Straight Leg Raises: AROM;Right;10 reps Long Arc Quad: AROM;Right;5 reps;Seated Knee Flexion: AROM;AAROM;Right;5 reps;Seated Goniometric ROM: 10-55* AAROM R knee    General Comments        Pertinent Vitals/Pain Pain Score: 2  Pain Location: R knee Pain Descriptors / Indicators: Sore Pain Intervention(s): Limited activity within patient's tolerance;Monitored during session;Premedicated before session;Ice applied    Home Living                      Prior Function            PT Goals (current goals can now be found in the care plan section) Acute Rehab PT Goals Patient Stated Goal: get back to being independent, do yardwork PT Goal Formulation: With patient Time For Goal Achievement: 07/26/16 Potential to Achieve Goals: Good Progress towards PT goals: Goals met/education completed, patient discharged from PT    Frequency  7X/week    PT Plan Current plan remains appropriate    Co-evaluation             End of Session Equipment Utilized During Treatment: Gait belt Activity Tolerance: Patient tolerated treatment well Patient left: in chair;with call bell/phone within reach     Time: 0839-0905 PT Time Calculation (min) (ACUTE ONLY):  26 min  Charges:  $Gait Training: 8-22 mins $Therapeutic Exercise: 8-22 mins                    G Codes:      [name] 07/14/2016, 9:11 AM [phone]

## 2017-05-16 ENCOUNTER — Other Ambulatory Visit: Payer: Self-pay | Admitting: Endocrinology

## 2017-05-16 DIAGNOSIS — I739 Peripheral vascular disease, unspecified: Secondary | ICD-10-CM

## 2017-05-18 ENCOUNTER — Other Ambulatory Visit: Payer: Medicare Other

## 2017-05-19 ENCOUNTER — Ambulatory Visit
Admission: RE | Admit: 2017-05-19 | Discharge: 2017-05-19 | Disposition: A | Discharge status: Home / Self Care | Payer: Medicare Other | Source: Ambulatory Visit | Attending: Endocrinology | Admitting: Endocrinology

## 2017-05-19 DIAGNOSIS — I739 Peripheral vascular disease, unspecified: Secondary | ICD-10-CM

## 2017-06-28 ENCOUNTER — Encounter (HOSPITAL_COMMUNITY): Payer: Medicare Other

## 2018-08-26 ENCOUNTER — Emergency Department
Admission: EM | Admit: 2018-08-26 | Discharge: 2018-08-26 | Disposition: A | Discharge status: Home / Self Care | Payer: Medicare Other | Attending: Emergency Medicine | Admitting: Emergency Medicine

## 2018-08-26 ENCOUNTER — Emergency Department: Payer: Medicare Other

## 2018-08-26 ENCOUNTER — Encounter: Payer: Self-pay | Admitting: Emergency Medicine

## 2018-08-26 ENCOUNTER — Other Ambulatory Visit: Payer: Self-pay

## 2018-08-26 DIAGNOSIS — W19XXXA Unspecified fall, initial encounter: Secondary | ICD-10-CM | POA: Insufficient documentation

## 2018-08-26 DIAGNOSIS — Y929 Unspecified place or not applicable: Secondary | ICD-10-CM | POA: Insufficient documentation

## 2018-08-26 DIAGNOSIS — I2510 Atherosclerotic heart disease of native coronary artery without angina pectoris: Secondary | ICD-10-CM | POA: Diagnosis not present

## 2018-08-26 DIAGNOSIS — Y999 Unspecified external cause status: Secondary | ICD-10-CM | POA: Insufficient documentation

## 2018-08-26 DIAGNOSIS — Y939 Activity, unspecified: Secondary | ICD-10-CM | POA: Insufficient documentation

## 2018-08-26 DIAGNOSIS — E039 Hypothyroidism, unspecified: Secondary | ICD-10-CM | POA: Insufficient documentation

## 2018-08-26 DIAGNOSIS — I10 Essential (primary) hypertension: Secondary | ICD-10-CM | POA: Diagnosis not present

## 2018-08-26 DIAGNOSIS — S4992XA Unspecified injury of left shoulder and upper arm, initial encounter: Secondary | ICD-10-CM | POA: Diagnosis present

## 2018-08-26 DIAGNOSIS — Z7901 Long term (current) use of anticoagulants: Secondary | ICD-10-CM | POA: Insufficient documentation

## 2018-08-26 DIAGNOSIS — S43015A Anterior dislocation of left humerus, initial encounter: Secondary | ICD-10-CM

## 2018-08-26 DIAGNOSIS — Z79899 Other long term (current) drug therapy: Secondary | ICD-10-CM | POA: Insufficient documentation

## 2018-08-26 MED ORDER — LIDOCAINE HCL (PF) 1 % IJ SOLN
INTRAMUSCULAR | Status: AC
  Filled 2018-08-26: qty 5

## 2018-08-26 MED ORDER — LIDOCAINE HCL (PF) 1 % IJ SOLN: 20.0000 mL | Freq: Once | INTRAMUSCULAR | Status: DC

## 2018-08-26 MED ORDER — HYDROMORPHONE HCL 1 MG/ML IJ SOLN
1.0000 mg | Freq: Once | INTRAMUSCULAR | Status: AC
  Administered 2018-08-26: 1 mg via INTRAMUSCULAR

## 2018-08-26 MED ORDER — HYDROMORPHONE HCL 1 MG/ML IJ SOLN
INTRAMUSCULAR | Status: AC
  Filled 2018-08-26: qty 1

## 2018-08-26 MED ORDER — LIDOCAINE HCL (PF) 1 % IJ SOLN: 15.0000 mL | Freq: Once | INTRAMUSCULAR | Status: DC

## 2018-08-26 MED ORDER — HYDROCODONE-ACETAMINOPHEN 5-325 MG PO TABS: 1.0000 | ORAL_TABLET | Freq: Four times a day (QID) | ORAL | 0 refills | Status: DC | PRN

## 2018-08-26 NOTE — ED Provider Notes (Signed)
[facility] Emergency Department Provider Note  ____________________________________________   None    (approximate)  I have reviewed the triage vital signs and the nursing notes.   HISTORY  Chief Complaint Shoulder Injury   HPI [name] is a 83 y.o. female who comes to the emergency department from urgent care with reported left anterior shoulder dislocation.  The patient is right-hand dominant.   She had a mechanical fall earlier today landing on her left arm sustaining sudden onset severe pain in her left shoulder.  No numbness or weakness.  Pain is worse with movement improved with rest.  She arrives with a radiology read and her on CD with imaging.  She did not hit her head.   Past Medical History:  Diagnosis Date  . Arthritis   . Blood in stool    saw GI doctor for evaluation  . CAD (coronary artery disease)   . Cataract   . GERD (gastroesophageal reflux disease)   . History of colon polyps    adenomatous  . Hyperlipidemia   . Hypertension   . Hypothyroidism   . Osteopenia   . PSVT (paroxysmal supraventricular tachycardia) ([place])   . RLS (restless legs syndrome)   . Thyroid disease    Hypothyroidism  . Vitamin D deficiency     Patient Active Problem List   Diagnosis Date Noted  . OA (osteoarthritis) of knee 07/12/2016    Past Surgical History:  Procedure Laterality Date  . CARDIAC CATHETERIZATION    . cataract surg Left 2/13   Dr. Ellie Lunch  . DILATION AND CURETTAGE OF UTERUS     x2  . heart catherization     07/08  . HIP SURGERY  5/12, 1/13   hip out of joint, replaced in OR  . JOINT REPLACEMENT    . KNEE ARTHROSCOPY Right   . LUMBAR LAMINECTOMY  02/17/11  . PERIPHERAL IRIDOTOMY     bil eyes  . THYROIDECTOMY, PARTIAL  1969  . TOTAL HIP ARTHROPLASTY Bilateral 1/04, 4/06  . TOTAL KNEE ARTHROPLASTY Right 07/12/2016   Procedure: RIGHT TOTAL KNEE ARTHROPLASTY;  Surgeon: Gaynelle Arabian, MD;  Location: [facility] ORS;  Service:  Orthopedics;  Laterality: Right;  . TUBAL LIGATION      Prior to Admission medications   Medication Sig Start Date End Date Taking? Authorizing Provider  hydrochlorothiazide (HYDRODIURIL) 25 MG tablet Take 25 mg by mouth every morning.     [provider]  HYDROcodone-acetaminophen (NORCO) 5-325 MG tablet Take 1 tablet by mouth every 6 (six) hours as needed for up to 7 doses for severe pain. 08/26/18   Darel Hong, MD  levothyroxine (SYNTHROID, LEVOTHROID) 88 MCG tablet Take 88 mcg by mouth every morning.     [provider]  lisinopril (PRINIVIL,ZESTRIL) 20 MG tablet Take 20 mg by mouth every morning.     [provider]  methocarbamol (ROBAXIN) 500 MG tablet Take 1 tablet (500 mg total) by mouth every 6 (six) hours as needed for muscle spasms. 07/13/16   Perkins, Alexzandrew L, PA-C  omeprazole (PRILOSEC) 20 MG capsule Take 1 capsule (20 mg total) by mouth daily. Patient taking differently: Take 20 mg by mouth every morning.  08/13/13   Irene Shipper, MD  ondansetron (ZOFRAN) 4 MG tablet Take 1 tablet (4 mg total) by mouth every 6 (six) hours as needed for nausea. 07/13/16   Perkins, Alexzandrew L, PA-C  oxyCODONE (OXY IR/ROXICODONE) 5 MG immediate release tablet Take 1-2 tablets (5-10  mg total) by mouth every 3 (three) hours as needed for moderate pain or severe pain. 07/13/16   Perkins, Alexzandrew L, PA-C  pravastatin (PRAVACHOL) 40 MG tablet Take 40 mg by mouth every morning.     [provider]  rivaroxaban (XARELTO) 10 MG TABS tablet Take 1 tablet (10 mg total) by mouth daily with breakfast. Take Xarelto for two and a half more weeks, then discontinue Xarelto. Once the patient has completed the Xarelto, they may resume the 81 mg Aspirin. 07/13/16   Perkins, Alexzandrew L, PA-C  rOPINIRole (REQUIP) 1 MG tablet Take 1 mg by mouth 3 (three) times daily as needed (resltess elgs).     [provider]  traMADol (ULTRAM) 50 MG tablet Take 1-2 tablets  (50-100 mg total) by mouth every 6 (six) hours as needed (mild pain). 07/13/16   Perkins, Alexzandrew L, PA-C    Allergies Patient has no known allergies.  Family History  Problem Relation Age of Onset  . Heart disease Father   . Hypertension Father   . Stroke Father   . Lung cancer Sister   . Hypertension Sister   . Thyroid disease Mother   . Heart disease Brother   . Diabetes Paternal Aunt        x 3  . Diabetes Paternal Grandmother     Social History Social History   Tobacco Use  . Smoking status: Never Smoker  . Smokeless tobacco: Never Used  Substance Use Topics  . Alcohol use: No  . Drug use: No    Review of Systems Constitutional: No fever/chills Cardiovascular: Denies chest pain. Respiratory: Denies shortness of breath. Gastrointestinal: No abdominal pain.  Positive for nausea no vomiting Musculoskeletal: Positive for shoulder pain Neurological: Negative for headaches   ____________________________________________   PHYSICAL EXAM:  VITAL SIGNS: ED Triage Vitals  Enc Vitals Group     BP 08/26/18 1338 (!) 175/92     Pulse Rate 08/26/18 1338 61     Resp --      Temp 08/26/18 1338 97.8 F (36.6 C)     Temp Source 08/26/18 1338 Oral     SpO2 08/26/18 1338 95 %     Weight 08/26/18 1337 167 lb (75.8 kg)     Height 08/26/18 1337 5\' 3"  (1.6 m)     Head Circumference --      Peak Flow --      Pain Score 08/26/18 1336 8     Pain Loc --      Pain Edu? --      Excl. in [place]? --     Constitutional: Alert and oriented x4 appears uncomfortable splinting her left shoulder Head: Atraumatic.  Pupils midrange and brisk Nose: No congestion/rhinnorhea. Mouth/Throat: No trismus Neck: No stridor.   Cardiovascular: Regular rate and rhythm Respiratory: Normal respiratory effort.  No retractions. MSK: Left shoulder with palpable lateral deformity.  Arm held in internal rotation.  Normal sensation over median ulnar radial axillary nerves.  2+ radial pulse Neurologic:   Normal speech and language. No gross focal neurologic deficits are appreciated.  Skin:  Skin is warm, dry and intact. No rash noted.    ____________________________________________  LABS (all labs ordered are listed, but only abnormal results are displayed)  Labs Reviewed - No data to display   __________________________________________  EKG   ____________________________________________  RADIOLOGY  X-ray from urgent care reviewed by me showing anterior shoulder dislocation Postreduction x-ray reviewed by me with no acute disease ____________________________________________   DIFFERENTIAL  includes but not limited to  Inferior shoulder dislocation, posterior shoulder dislocation, AC separation, humeral neck fracture   PROCEDURES  Procedure(s) performed: Yes  .Joint Aspiration/Arthrocentesis Date/Time: 08/26/2018 2:23 PM Performed by: Darel Hong, MD Authorized by: Darel Hong, MD   Consent:    Consent obtained:  Verbal   Consent given by:  Patient   Risks discussed:  Bleeding, pain and infection   Alternatives discussed:  Alternative treatment Location:    Location:  Shoulder   Shoulder:  L glenohumeral Anesthesia (see MAR for exact dosages):    Anesthesia method:  None Procedure details:    Needle gauge:  18 G   Ultrasound guidance: no     Approach:  Lateral   Aspirate amount:  Scant   Aspirate characteristics:  Bloody   Steroid injected: no (I injected 15 cc of 1% lidocaine without epinephrine directly into the joint space)   Post-procedure details:    Dressing:  Adhesive bandage   Patient tolerance of procedure:  Tolerated well, no immediate complications Reduction of dislocation Date/Time: 08/26/2018 2:23 PM Performed by: Darel Hong, MD Authorized by: Darel Hong, MD  Consent: Verbal consent obtained. Consent given by: patient Patient understanding: patient states understanding of the procedure being performed Relevant documents:  relevant documents present and verified Test results: test results available and properly labeled Imaging studies: imaging studies available Patient identity confirmed: verbally with patient Local anesthesia used: yes Anesthesia: hematoma block  Anesthesia: Local anesthesia used: yes Local Anesthetic: lidocaine 1% without epinephrine  Sedation: Patient sedated: no  Patient tolerance: Patient tolerated the procedure well with no immediate complications Comments: Reduce the patient's anterior shoulder dislocation using external rotation supination and scapular manipulation  .Splint Application Date/Time: 03/22/7123 10:08 PM Performed by: Darel Hong, MD Authorized by: Darel Hong, MD   Consent:    Consent obtained:  Verbal   Consent given by:  Patient   Risks discussed:  Numbness, pain and swelling   Alternatives discussed:  Alternative treatment Pre-procedure details:    Sensation:  Normal Procedure details:    Laterality:  Left   Location:  Shoulder Post-procedure details:    Pain:  Improved   Sensation:  Normal   Patient tolerance of procedure:  Tolerated well, no immediate complications Comments:     Left arm sling    Critical Care performed: no  ____________________________________________   INITIAL IMPRESSION / ASSESSMENT AND PLAN / ED COURSE  Pertinent labs & imaging results that were available during my care of the patient were reviewed by me and considered in my medical decision making (see chart for details).   As part of my medical decision making, I reviewed the following data within the [place] History obtained from family if available, nursing notes, old chart and ekg, as well as notes from prior ED visits.  The patient arrives neurovascularly intact with history concerning for anterior shoulder dislocation.  The patient brought CDs of her imaging with her and I personally reviewed these images showing an anterior shoulder  dislocation with no fracture.  I gave her 1 mg of intramuscular hydromorphone and then in usual sterile fashion used an 18-gauge spinal needle laterally to aspirate and then inject her shoulder joint.  I got a flash of hematoma and then easily infuse 15 cc of 1% lidocaine without epinephrine.  I then left the patient for 10 minutes and when I returned her pain was improved.  Using scapular manipulation and supination external rotation with elbow held in adduction I was  able to successfully reduce the patient's dislocation.  Her pain was immediately improved.  Subsequent x-ray confirmed successful reduction.  Placed in a sling I have given her orthopedic follow-up.  Strict return precautions have been given.      ____________________________________________   FINAL CLINICAL IMPRESSION(S) / ED DIAGNOSES  Final diagnoses:  Anterior dislocation of left shoulder, initial encounter      NEW MEDICATIONS STARTED DURING THIS VISIT:  Discharge Medication List as of 08/26/2018  2:29 PM    START taking these medications   Details  HYDROcodone-acetaminophen (NORCO) 5-325 MG tablet Take 1 tablet by mouth every 6 (six) hours as needed for up to 7 doses for severe pain., Starting Sat 08/26/2018, Print         Note:  This document was prepared using Dragon voice recognition software and may include unintentional dictation errors.      Darel Hong, MD 08/26/18 2212

## 2018-08-26 NOTE — ED Triage Notes (Signed)
[place] against a rock wall earlier today.  Seen through Urgent care, and had xray of left shoulder which shows dislocation.l

## 2018-08-26 NOTE — ED Notes (Signed)
Arm sling placed, pt verbalizes dc/fu instructions.

## 2018-08-26 NOTE — Discharge Instructions (Addendum)
Please wear your sling for comfort and follow-up with the orthopedic surgeon within 1 week for recheck.  Return to the emergency department sooner for any concerns whatsoever.  It was a pleasure to take care of you today, and thank you for coming to our emergency department.  If you have any questions or concerns before leaving please ask the nurse to grab me and I'm more than happy to go through your aftercare instructions again.  If you were prescribed any opioid pain medication today such as Norco, Vicodin, Percocet, morphine, hydrocodone, or oxycodone please make sure you do not drive when you are taking this medication as it can alter your ability to drive safely.  If you have any concerns once you are home that you are not improving or are in fact getting worse before you can make it to your follow-up appointment, please do not hesitate to call 911 and come back for further evaluation.  Darel Hong, MD

## 2020-01-30 ENCOUNTER — Other Ambulatory Visit: Payer: Medicare Other

## 2023-07-25 ENCOUNTER — Encounter: Payer: Self-pay | Admitting: Endocrinology

## 2023-07-27 ENCOUNTER — Other Ambulatory Visit (HOSPITAL_COMMUNITY): Payer: Self-pay | Admitting: Endocrinology

## 2023-07-27 ENCOUNTER — Ambulatory Visit (HOSPITAL_COMMUNITY)
Admission: RE | Admit: 2023-07-27 | Discharge: 2023-07-27 | Disposition: A | Discharge status: Home / Self Care | Payer: Medicare Other | Source: Ambulatory Visit | Attending: Vascular Surgery | Admitting: Vascular Surgery

## 2023-07-27 DIAGNOSIS — M7989 Other specified soft tissue disorders: Secondary | ICD-10-CM | POA: Insufficient documentation

## 2024-02-21 ENCOUNTER — Other Ambulatory Visit: Payer: Self-pay

## 2024-02-21 ENCOUNTER — Other Ambulatory Visit (HOSPITAL_BASED_OUTPATIENT_CLINIC_OR_DEPARTMENT_OTHER): Payer: Self-pay | Admitting: Student

## 2024-02-21 ENCOUNTER — Telehealth (HOSPITAL_BASED_OUTPATIENT_CLINIC_OR_DEPARTMENT_OTHER): Payer: Self-pay

## 2024-02-21 ENCOUNTER — Emergency Department (HOSPITAL_COMMUNITY)
Admission: EM | Admit: 2024-02-21 | Discharge: 2024-02-22 | Discharge status: Against Medical Advice | Payer: Medicare Other | Attending: Emergency Medicine | Admitting: Emergency Medicine

## 2024-02-21 ENCOUNTER — Emergency Department (HOSPITAL_COMMUNITY): Payer: Medicare Other

## 2024-02-21 ENCOUNTER — Encounter (INDEPENDENT_AMBULATORY_CARE_PROVIDER_SITE_OTHER): Payer: Medicare Other

## 2024-02-21 ENCOUNTER — Encounter (HOSPITAL_COMMUNITY): Payer: Self-pay

## 2024-02-21 DIAGNOSIS — Z5321 Procedure and treatment not carried out due to patient leaving prior to being seen by health care provider: Secondary | ICD-10-CM | POA: Diagnosis not present

## 2024-02-21 DIAGNOSIS — S60511A Abrasion of right hand, initial encounter: Secondary | ICD-10-CM | POA: Insufficient documentation

## 2024-02-21 DIAGNOSIS — W19XXXA Unspecified fall, initial encounter: Secondary | ICD-10-CM | POA: Diagnosis not present

## 2024-02-21 DIAGNOSIS — S60512A Abrasion of left hand, initial encounter: Secondary | ICD-10-CM | POA: Insufficient documentation

## 2024-02-21 DIAGNOSIS — M25562 Pain in left knee: Secondary | ICD-10-CM | POA: Insufficient documentation

## 2024-02-21 DIAGNOSIS — M79604 Pain in right leg: Secondary | ICD-10-CM | POA: Diagnosis not present

## 2024-02-21 DIAGNOSIS — M79642 Pain in left hand: Secondary | ICD-10-CM | POA: Diagnosis present

## 2024-02-21 LAB — CBC WITH DIFFERENTIAL/PLATELET
Abs Immature Granulocytes: 0.02 10*3/uL (ref 0.00–0.07)
Basophils Absolute: 0 10*3/uL (ref 0.0–0.1)
Basophils Relative: 1 %
Eosinophils Absolute: 0.2 10*3/uL (ref 0.0–0.5)
Eosinophils Relative: 3 %
HCT: 41.1 % (ref 36.0–46.0)
Hemoglobin: 13.6 g/dL (ref 12.0–15.0)
Immature Granulocytes: 0 %
Lymphocytes Relative: 15 %
Lymphs Abs: 0.8 10*3/uL (ref 0.7–4.0)
MCH: 32 pg (ref 26.0–34.0)
MCHC: 33.1 g/dL (ref 30.0–36.0)
MCV: 96.7 fL (ref 80.0–100.0)
Monocytes Absolute: 0.4 10*3/uL (ref 0.1–1.0)
Monocytes Relative: 8 %
Neutro Abs: 4 10*3/uL (ref 1.7–7.7)
Neutrophils Relative %: 73 %
Platelets: 105 10*3/uL — ABNORMAL LOW (ref 150–400)
RBC: 4.25 MIL/uL (ref 3.87–5.11)
RDW: 15 % (ref 11.5–15.5)
WBC: 5.5 10*3/uL (ref 4.0–10.5)
nRBC: 0 % (ref 0.0–0.2)

## 2024-02-21 LAB — BASIC METABOLIC PANEL
Anion gap: 10 (ref 5–15)
BUN: 41 mg/dL — ABNORMAL HIGH (ref 8–23)
CO2: 27 mmol/L (ref 22–32)
Calcium: 9.9 mg/dL (ref 8.9–10.3)
Chloride: 106 mmol/L (ref 98–111)
Creatinine, Ser: 1.52 mg/dL — ABNORMAL HIGH (ref 0.44–1.00)
GFR, Estimated: 33 mL/min — ABNORMAL LOW (ref >60)
Glucose, Bld: 80 mg/dL (ref 70–99)
Potassium: 4 mmol/L (ref 3.5–5.1)
Sodium: 143 mmol/L (ref 135–145)

## 2024-02-21 MED ORDER — ACETAMINOPHEN 325 MG PO TABS
650.0000 mg | ORAL_TABLET | Freq: Once | ORAL | Status: AC
  Administered 2024-02-21: 650 mg via ORAL
  Filled 2024-02-21: qty 2

## 2024-02-21 NOTE — ED Notes (Signed)
 Patient's phone number called x2, no answer.

## 2024-02-21 NOTE — Telephone Encounter (Signed)
 Patient presents today for DVT study, asked to assist with patient by echo tech. Per Echo tech patient was seen entering the building with unsteady gait and slowed speech. Patient states that she has been unsteady, but the speech change started about a week ago and is not her normal. She has not been seen in regards to this. RN informed that she was asking staff member who helped her up to the second floor if these could be signs that she may have had a stroke recently. Consulted with Gillian Shields, NP. As we are not her regular medical team or ordering provider of scan, NP advised patient be seen in the ED for workup, if refuses to be seen, instruct pt to call primary care for follow up.   Patient given instructions, verbalizes understanding but declines ED work up at this time. Patient taken out to granddaughter and given same instructions for follow up.

## 2024-02-21 NOTE — ED Notes (Signed)
Pt called for vitals with no answer.  

## 2024-02-21 NOTE — ED Triage Notes (Signed)
 Pt here for fall. Pt has had neurological changes since January and is here for MRI for slurred speech. Denies LOC, denies hitting head, denies blood thinners. Axox4.

## 2024-02-21 NOTE — ED Provider Triage Note (Signed)
 Emergency Medicine Provider Triage Evaluation Note  [name] , a 89 y.o. female  was evaluated in triage.  Pt complains of mechanical fall, down onto left knee and left hand.  However chart review shows that the patient was also getting a DVT ultrasound today when she was noted to have an unsteady gait, slurred speech and was at times aphasic.  I agree with this on my initial exam.  Patient admits to feeling dizzy and states that she is using a single crutch to help her get around.  But she feels like her speech is baseline, has no other acute complaints but does not seem like a great historian.  Review of Systems  Positive: Fall, left knee pain, left hand pain, unsteady gait, reported slurred speech/aphasia Negative: Headache, neck pain, head injury, chest pain, belly pain, pelvis pain  Physical Exam  BP (!) 158/75 (BP Location: Left Arm)   Pulse (!) 55   Temp (!) 95.4 F (35.2 C) (Temporal)   Resp 17   Ht 5\' 4"  (1.626 m)   Wt 74.4 kg   LMP 12/27/2000   SpO2 94%   BMI 28.15 kg/m  Gen:   Awake, no distress, symmetric face Resp:  Normal effort  MSK:   Moves extremities without difficulty, tenderness to palpation a little swelling of the left knee, abrasions to bilateral hands, most significant on the left palm Other:  Equal strength and 4/4 extremities, speech at times is slurred, she has pauses and appears to be aphasic but she denies this  Medical Decision Making  Medically screening exam initiated at 4:57 PM.  Appropriate orders placed.  [name] was informed that the remainder of the evaluation will be completed by another provider, this initial triage assessment does not replace that evaluation, and the importance of remaining in the ED until their evaluation is complete.  89 year old female here after mechanical fall but was also seen earlier by medical staff with concern for unsteady gait, slurred speech/aphasia.  Patient evaluated sitting up in chair, fully clothed,  limited PE. Orders placed. Patient was counseled that they need to remain in the ED until the completion of their work-up including a full H&P and results of any tests.  The patient appears stable and the remainder of the encounter may be completed by another provider.   Rozelle Logan, DO 02/21/24 1659

## 2024-02-21 NOTE — ED Notes (Signed)
 Unable to obtain temp at ems triage, attempted oral and axillary x 2.

## 2024-02-21 NOTE — ED Notes (Signed)
 Patient called x2 by this RN, no answer, patient not visualized in ED lobby at this time.

## 2024-02-21 NOTE — Progress Notes (Signed)
 Patient ID: [name], female   DOB: 1935-02-20, 89 y.o.   MRN: [id number]  Patient presents today for DVT study, asked to assist with patient by front desk. Per front desk patient was seen entering the building with unsteady gait and slowed speech. Patient states that she has been unsteady, but the speech change started about a week ago and is not her normal. She has not been seen in regards to this. Patient was alert and oriented. Patient's granddaughter stated she had slowed speeh for the past month. Granddaughter stated patient was only confused when patient experiencing stress. Patient refused emergency room x3. I spoke with Alfredo Martinez, PA at [facility] to see if patient was the same yesterday during visit. Jill Alexanders agreed patient was alert but speech was slow and patient resisted any follow up testing. Encouraged patient and granddaughter to see if primary can see patient sooner than May.   Patient given instructions, verbalizes understanding but declines ED work up at this time. Patient taken out to granddaughter and given same instructions for follow up.   Jeryl Columbia, RDCS, RVT

## 2024-02-22 NOTE — ED Notes (Signed)
 Pt called multiple times for vitals with no answer. Will move to OTF at this time.

## 2024-03-06 ENCOUNTER — Encounter: Payer: Self-pay | Admitting: Diagnostic Neuroimaging

## 2024-03-06 ENCOUNTER — Ambulatory Visit: Admitting: Diagnostic Neuroimaging

## 2024-03-06 VITALS — BP 136/70 | HR 78 | Ht 62.0 in | Wt 158.0 lb

## 2024-03-06 DIAGNOSIS — R269 Unspecified abnormalities of gait and mobility: Secondary | ICD-10-CM | POA: Diagnosis not present

## 2024-03-06 DIAGNOSIS — G20C Parkinsonism, unspecified: Secondary | ICD-10-CM

## 2024-03-06 MED ORDER — CARBIDOPA-LEVODOPA 25-100 MG PO TABS: 1.0000 | ORAL_TABLET | Freq: Three times a day (TID) | ORAL | 6 refills | Status: DC

## 2024-03-06 NOTE — Patient Instructions (Addendum)
-   suspect idiopathic parkinson's disease vs other parkinson plus syndrome (such as dementia with lewy bodies)  - check labs (rule out other causes of ataxia)  - start carbidopa / levodopa (25/100) half tab three times a day with meals x 1-2 weeks; then 1 tab three times a day with meals  - fall precautions reviewed; no driving  - continue rollator walker; consider home PT / OT

## 2024-03-06 NOTE — Progress Notes (Signed)
 [facility]  PATIENT: [name] DOB: Mar 11, 1935  REFERRING CLINICIAN: Adrian Prince, MD HISTORY FROM: patient  REASON FOR VISIT: new consult   HISTORICAL  CHIEF COMPLAINT:  Chief Complaint  Patient presents with   Gait Problem    Rm 7 with son [name] Pt is well, reports she has been having gait impairment and imbalance for the last year. She has had at least 5 falls in the last year.      HISTORY OF PRESENT ILLNESS:   89 year old female here for evaluation of gait difficulty, speech difficulty, memory loss.  Gait difficulty and falls started about 1 year ago.  This is progressively worsened over time.  Patient has a hard time articulating the onset and progression of her symptoms.  She was living alone, but checked on on a daily basis by her son.  Few months ago she moved in with her son due to worsening symptoms.  Starting 2 to 3 months ago symptoms significantly worsened with more gait and balance difficulty, cognitive decline, slow speech, quiet voice, masked facies and other symptoms.   REVIEW OF SYSTEMS: Full 14 system review of systems performed and negative with exception of: As per HPI.  ALLERGIES: No Known Allergies  HOME MEDICATIONS: Outpatient Medications Prior to Visit  Medication Sig Dispense Refill   Biotin [address] MCG TABS Take by mouth daily.     calcitRIOL (ROCALTROL) 0.25 MCG capsule Take 0.25 mcg by mouth daily.     Calcium 200 MG TABS Take by mouth daily.     hydrochlorothiazide (HYDRODIURIL) 25 MG tablet Take 25 mg by mouth every morning.      levothyroxine (SYNTHROID, LEVOTHROID) 88 MCG tablet Take 88 mcg by mouth every morning.      lisinopril (PRINIVIL,ZESTRIL) 20 MG tablet Take 20 mg by mouth every morning.      pravastatin (PRAVACHOL) 40 MG tablet Take 40 mg by mouth every morning.      rOPINIRole (REQUIP) 1 MG tablet Take 1 mg by mouth 3 (three) times daily as needed (resltess elgs).      HYDROcodone-acetaminophen (NORCO)  5-325 MG tablet Take 1 tablet by mouth every 6 (six) hours as needed for up to 7 doses for severe pain. 7 tablet 0   methocarbamol (ROBAXIN) 500 MG tablet Take 1 tablet (500 mg total) by mouth every 6 (six) hours as needed for muscle spasms. 80 tablet 0   omeprazole (PRILOSEC) 20 MG capsule Take 1 capsule (20 mg total) by mouth daily. (Patient taking differently: Take 20 mg by mouth every morning. ) 20 capsule 0   ondansetron (ZOFRAN) 4 MG tablet Take 1 tablet (4 mg total) by mouth every 6 (six) hours as needed for nausea. 40 tablet 0   oxyCODONE (OXY IR/ROXICODONE) 5 MG immediate release tablet Take 1-2 tablets (5-10 mg total) by mouth every 3 (three) hours as needed for moderate pain or severe pain. 80 tablet 0   rivaroxaban (XARELTO) 10 MG TABS tablet Take 1 tablet (10 mg total) by mouth daily with breakfast. Take Xarelto for two and a half more weeks, then discontinue Xarelto. Once the patient has completed the Xarelto, they may resume the 81 mg Aspirin. 19 tablet 0   traMADol (ULTRAM) 50 MG tablet Take 1-2 tablets (50-100 mg total) by mouth every 6 (six) hours as needed (mild pain). 80 tablet 1   No facility-administered medications prior to visit.    PAST MEDICAL HISTORY: Past Medical History:  Diagnosis Date   Arthritis  Blood in stool    saw GI doctor for evaluation   CAD (coronary artery disease)    Cataract    GERD (gastroesophageal reflux disease)    History of colon polyps    adenomatous   Hyperlipidemia    Hypertension    Hypothyroidism    Osteopenia    PSVT (paroxysmal supraventricular tachycardia) (HCC)    RLS (restless legs syndrome)    Thyroid disease    Hypothyroidism   Vitamin D deficiency     PAST SURGICAL HISTORY: Past Surgical History:  Procedure Laterality Date   CARDIAC CATHETERIZATION     cataract surg Left 2/13   Dr. Charlotte Sanes   DILATION AND CURETTAGE OF UTERUS     x2   heart catherization     07/08   HIP SURGERY  5/12, 1/13   hip out of joint,  replaced in OR   JOINT REPLACEMENT     KNEE ARTHROSCOPY Right    LUMBAR LAMINECTOMY  02/17/11   PERIPHERAL IRIDOTOMY     bil eyes   THYROIDECTOMY, PARTIAL  1969   TOTAL HIP ARTHROPLASTY Bilateral 1/04, 4/06   TOTAL KNEE ARTHROPLASTY Right 07/12/2016   Procedure: RIGHT TOTAL KNEE ARTHROPLASTY;  Surgeon: Ollen Gross, MD;  Location: [facility];  Service: Orthopedics;  Laterality: Right;   TUBAL LIGATION      FAMILY HISTORY: Family History  Problem Relation Age of Onset   Heart disease Father    Hypertension Father    Stroke Father    Lung cancer Sister    Hypertension Sister    Thyroid disease Mother    Heart disease Brother    Diabetes Paternal Aunt        x 3   Diabetes Paternal Grandmother     SOCIAL HISTORY: Social History   Socioeconomic History   Marital status: Widowed    Spouse name: Not on file   Number of children: 1   Years of education: Not on file   Highest education level: Not on file  Occupational History   Occupation: retired  Tobacco Use   Smoking status: Never   Smokeless tobacco: Never  Substance and Sexual Activity   Alcohol use: No   Drug use: No   Sexual activity: Never  Other Topics Concern   Not on file  Social History Narrative   Not on file   Social Drivers of Health   Financial Resource Strain: Not on file  Food Insecurity: Not on file  Transportation Needs: Not on file  Physical Activity: Not on file  Stress: Not on file  Social Connections: Not on file  Intimate Partner Violence: Not on file     PHYSICAL EXAM  GENERAL EXAM/CONSTITUTIONAL: Vitals:  Vitals:   03/06/24 1512  BP: 136/70  Pulse: 78  Weight: 158 lb (71.7 kg)  Height: 5\' 2"  (1.575 m)   Body mass index is 28.9 kg/m. Wt Readings from Last 3 Encounters:  03/06/24 158 lb (71.7 kg)  02/21/24 164 lb (74.4 kg)  08/26/18 167 lb (75.8 kg)   Patient is in no distress; well developed, nourished and groomed; neck is supple  CARDIOVASCULAR: Examination of carotid  arteries is normal; no carotid bruits Regular rate and rhythm, no murmurs Examination of peripheral vascular system by observation and palpation is normal  EYES: Ophthalmoscopic exam of optic discs and posterior segments is normal; no papilledema or hemorrhages No results found.  MUSCULOSKELETAL: Gait, strength, tone, movements noted in Neurologic exam below  NEUROLOGIC: MENTAL STATUS:  No data to display         awake, alert, oriented to person SLIGHTLY REPETITIVE POOR INSIGHT DECR MEMORY DECR FLUENCY MILD MOTOR APRAXIA  CRANIAL NERVE:  2nd, 3rd, 4th, 6th - pupils equal and reactive to light, visual fields full to confrontation, extraocular muscles intact, no nystagmus 5th - facial sensation symmetric 7th - facial strength symmetric 8th - hearing intact 9th - palate elevates symmetrically, uvula midline 11th - shoulder shrug symmetric 12th - tongue protrusion midline SOFT, HOARSE VOICE; SLOW SPEECH; MASKED FACIES  MOTOR:  BUE 4/5; BLE 5 MILD COGWHEELING RIGIDITY IN BUE RARE REST TREMOR IN CONTRALATERAL HAND AND MOUTH WITH RAPID ALT MOVEMENTS MOD-SEVERE BRADYKINESIA IN BUE AND BLE  SENSORY:  normal and symmetric to light touch, temperature, vibration  COORDINATION:  finger-nose-finger, fine finger movements SLOW  REFLEXES:  deep tendon reflexes 2+ IN BUE; 1+ IN BLE NEG HOFFMANS POSITIVE SNOUT AND GLABELLAR REFLEXES  GAIT/STATION:  SLOW TO STAND; VERY UNSTEADY; GAIT FREEZING ON INITIATION; EN BLOC TURNING; ALMOST FALLS WITH GOING BACK TO WHEEL CHAIR WITH ASSISTANCE    DIAGNOSTIC DATA (LABS, IMAGING, TESTING) - I reviewed patient records, labs, notes, testing and imaging myself where available.  Lab Results  Component Value Date   WBC 5.5 02/21/2024   HGB 13.6 02/21/2024   HCT 41.1 02/21/2024   MCV 96.7 02/21/2024   PLT 105 (L) 02/21/2024      Component Value Date/Time   NA 143 02/21/2024 1709   K 4.0 02/21/2024 1709   CL 106 02/21/2024 1709    CO2 27 02/21/2024 1709   GLUCOSE 80 02/21/2024 1709   BUN 41 (H) 02/21/2024 1709   CREATININE 1.52 (H) 02/21/2024 1709   CALCIUM 9.9 02/21/2024 1709   PROT 7.3 07/05/2016 1140   ALBUMIN 4.1 07/05/2016 1140   AST 23 07/05/2016 1140   ALT 14 07/05/2016 1140   ALKPHOS 89 07/05/2016 1140   BILITOT 1.1 07/05/2016 1140   GFRNONAA 33 (L) 02/21/2024 1709   GFRAA 49 (L) 07/14/2016 0443   No results found for: "CHOL", "HDL", "LDLCALC", "LDLDIRECT", "TRIG", "CHOLHDL" No results found for: "HGBA1C" No results found for: "VITAMINB12" No results found for: "TSH"     ASSESSMENT AND PLAN  89 y.o. year old female here with gait difficulty, cognitive decline, bradykinesia, intermittent tremor, postural instability, dysarthria, starting in early 2024, worsening in 2025.  Signs symptoms most consistent with neurodegenerative disorder such as Parkinson's disease versus other Parkinson plus syndrome.   Dx:  1. Gait abnormality   2. Parkinsonism, unspecified Parkinsonism type (HCC)     PLAN:  GAIT DIFFICULTY, FALLS, COGNITIVE DECLINE MASKED FACIES, RESTING TREMOR, BRADYKINESIA, POSTURAL INSTABILITY / ATAXIA, DYSARTHRIA (since early 2024; worsening over time)  - suspect idiopathic parkinson's disease vs other parkinson plus syndrome (such as dementia with lewy bodies)  - check labs (rule out other causes of ataxia)  - start carbidopa / levodopa (25/100) half tab three times a day with meals x 1-2 weeks; then 1 tab three times a day with meals  - fall precautions reviewed; no driving  - continue rollator walker; consider home PT / OT  - other safety supervision and support issues reviewed; fortunately patient has excellent support from her family who she is living with now  Orders Placed This Encounter  Procedures   Autoimmune Neurology Ab   Vitamin B12   Vitamin E   Meds ordered this encounter  Medications   carbidopa-levodopa (SINEMET IR) 25-100 MG tablet    Sig: Take  1 tablet  by mouth 3 (three) times daily before meals.    Dispense:  90 tablet    Refill:  6   Return in about 4 months (around 07/06/2024) for MyChart visit (15 min).  I spent 60 minutes of face-to-face and non-face-to-face time with patient.  This included previsit chart review, lab review, study review, order entry, electronic health record documentation, patient education.    Suanne Marker, MD 03/06/2024, 4:48 PM Certified in Neurology, Neurophysiology and Neuroimaging  [facility] [address] [place], [place] [address] [phone]

## 2024-03-08 NOTE — Progress Notes (Signed)
 Normal labs. -VRP

## 2024-03-19 ENCOUNTER — Emergency Department

## 2024-03-19 ENCOUNTER — Other Ambulatory Visit: Payer: Self-pay

## 2024-03-19 ENCOUNTER — Inpatient Hospital Stay
Admission: EM | Admit: 2024-03-19 | Discharge: 2024-03-23 | DRG: 871 | Disposition: A | Discharge status: Home Health | Attending: Student in an Organized Health Care Education/Training Program | Admitting: Student in an Organized Health Care Education/Training Program

## 2024-03-19 ENCOUNTER — Telehealth: Payer: Self-pay | Admitting: Diagnostic Neuroimaging

## 2024-03-19 DIAGNOSIS — E785 Hyperlipidemia, unspecified: Secondary | ICD-10-CM | POA: Diagnosis present

## 2024-03-19 DIAGNOSIS — E66811 Obesity, class 1: Secondary | ICD-10-CM | POA: Diagnosis present

## 2024-03-19 DIAGNOSIS — A419 Sepsis, unspecified organism: Secondary | ICD-10-CM | POA: Diagnosis not present

## 2024-03-19 DIAGNOSIS — Z833 Family history of diabetes mellitus: Secondary | ICD-10-CM

## 2024-03-19 DIAGNOSIS — Z6830 Body mass index (BMI) 30.0-30.9, adult: Secondary | ICD-10-CM

## 2024-03-19 DIAGNOSIS — Z96643 Presence of artificial hip joint, bilateral: Secondary | ICD-10-CM | POA: Diagnosis present

## 2024-03-19 DIAGNOSIS — Z860100 Personal history of colon polyps, unspecified: Secondary | ICD-10-CM

## 2024-03-19 DIAGNOSIS — G20C Parkinsonism, unspecified: Secondary | ICD-10-CM | POA: Diagnosis present

## 2024-03-19 DIAGNOSIS — Z79899 Other long term (current) drug therapy: Secondary | ICD-10-CM

## 2024-03-19 DIAGNOSIS — R6521 Severe sepsis with septic shock: Secondary | ICD-10-CM | POA: Diagnosis present

## 2024-03-19 DIAGNOSIS — N390 Urinary tract infection, site not specified: Secondary | ICD-10-CM | POA: Diagnosis present

## 2024-03-19 DIAGNOSIS — N179 Acute kidney failure, unspecified: Secondary | ICD-10-CM | POA: Diagnosis present

## 2024-03-19 DIAGNOSIS — G2581 Restless legs syndrome: Secondary | ICD-10-CM | POA: Diagnosis present

## 2024-03-19 DIAGNOSIS — Z66 Do not resuscitate: Secondary | ICD-10-CM | POA: Diagnosis present

## 2024-03-19 DIAGNOSIS — R4182 Altered mental status, unspecified: Secondary | ICD-10-CM | POA: Diagnosis not present

## 2024-03-19 DIAGNOSIS — G9341 Metabolic encephalopathy: Secondary | ICD-10-CM | POA: Diagnosis present

## 2024-03-19 DIAGNOSIS — T68XXXA Hypothermia, initial encounter: Principal | ICD-10-CM

## 2024-03-19 DIAGNOSIS — R6520 Severe sepsis without septic shock: Secondary | ICD-10-CM | POA: Diagnosis not present

## 2024-03-19 DIAGNOSIS — E039 Hypothyroidism, unspecified: Secondary | ICD-10-CM | POA: Diagnosis present

## 2024-03-19 DIAGNOSIS — Z96651 Presence of right artificial knee joint: Secondary | ICD-10-CM | POA: Diagnosis present

## 2024-03-19 DIAGNOSIS — I2510 Atherosclerotic heart disease of native coronary artery without angina pectoris: Secondary | ICD-10-CM | POA: Diagnosis present

## 2024-03-19 DIAGNOSIS — D696 Thrombocytopenia, unspecified: Secondary | ICD-10-CM | POA: Diagnosis present

## 2024-03-19 DIAGNOSIS — N189 Chronic kidney disease, unspecified: Secondary | ICD-10-CM | POA: Diagnosis present

## 2024-03-19 DIAGNOSIS — E860 Dehydration: Secondary | ICD-10-CM | POA: Diagnosis present

## 2024-03-19 DIAGNOSIS — Z9181 History of falling: Secondary | ICD-10-CM

## 2024-03-19 DIAGNOSIS — E16A1 Hypoglycemia level 1: Secondary | ICD-10-CM | POA: Diagnosis present

## 2024-03-19 DIAGNOSIS — I129 Hypertensive chronic kidney disease with stage 1 through stage 4 chronic kidney disease, or unspecified chronic kidney disease: Secondary | ICD-10-CM | POA: Diagnosis present

## 2024-03-19 DIAGNOSIS — Z8249 Family history of ischemic heart disease and other diseases of the circulatory system: Secondary | ICD-10-CM

## 2024-03-19 DIAGNOSIS — Z79890 Hormone replacement therapy (postmenopausal): Secondary | ICD-10-CM | POA: Diagnosis not present

## 2024-03-19 DIAGNOSIS — Z823 Family history of stroke: Secondary | ICD-10-CM

## 2024-03-19 DIAGNOSIS — A4151 Sepsis due to Escherichia coli [E. coli]: Principal | ICD-10-CM | POA: Diagnosis present

## 2024-03-19 DIAGNOSIS — M8580 Other specified disorders of bone density and structure, unspecified site: Secondary | ICD-10-CM | POA: Diagnosis present

## 2024-03-19 LAB — CBC WITH DIFFERENTIAL/PLATELET
Abs Immature Granulocytes: 0.03 10*3/uL (ref 0.00–0.07)
Basophils Absolute: 0 10*3/uL (ref 0.0–0.1)
Basophils Relative: 1 %
Eosinophils Absolute: 0.1 10*3/uL (ref 0.0–0.5)
Eosinophils Relative: 1 %
HCT: 39.9 % (ref 36.0–46.0)
Hemoglobin: 13.3 g/dL (ref 12.0–15.0)
Immature Granulocytes: 1 %
Lymphocytes Relative: 11 %
Lymphs Abs: 0.7 10*3/uL (ref 0.7–4.0)
MCH: 31.4 pg (ref 26.0–34.0)
MCHC: 33.3 g/dL (ref 30.0–36.0)
MCV: 94.3 fL (ref 80.0–100.0)
Monocytes Absolute: 0.4 10*3/uL (ref 0.1–1.0)
Monocytes Relative: 6 %
Neutro Abs: 5.2 10*3/uL (ref 1.7–7.7)
Neutrophils Relative %: 80 %
Platelets: 89 10*3/uL — ABNORMAL LOW (ref 150–400)
RBC: 4.23 MIL/uL (ref 3.87–5.11)
RDW: 15.1 % (ref 11.5–15.5)
Smear Review: NORMAL
WBC: 6.3 10*3/uL (ref 4.0–10.5)
nRBC: 0 % (ref 0.0–0.2)

## 2024-03-19 LAB — CBG MONITORING, ED
Glucose-Capillary: 55 mg/dL — ABNORMAL LOW (ref 70–99)
Glucose-Capillary: 144 mg/dL — ABNORMAL HIGH (ref 70–99)

## 2024-03-19 LAB — URINE DRUG SCREEN, QUALITATIVE (ARMC ONLY)
Amphetamines, Ur Screen: NOT DETECTED
Barbiturates, Ur Screen: NOT DETECTED
Benzodiazepine, Ur Scrn: POSITIVE — AB
Cannabinoid 50 Ng, Ur ~~LOC~~: NOT DETECTED
Cocaine Metabolite,Ur ~~LOC~~: NOT DETECTED
MDMA (Ecstasy)Ur Screen: NOT DETECTED
Methadone Scn, Ur: NOT DETECTED
Opiate, Ur Screen: NOT DETECTED
Phencyclidine (PCP) Ur S: NOT DETECTED
Tricyclic, Ur Screen: NOT DETECTED

## 2024-03-19 LAB — COMPREHENSIVE METABOLIC PANEL
ALT: 12 U/L (ref 0–44)
AST: 72 U/L — ABNORMAL HIGH (ref 15–41)
Albumin: 3.4 g/dL — ABNORMAL LOW (ref 3.5–5.0)
Alkaline Phosphatase: 120 U/L (ref 38–126)
Anion gap: 11 (ref 5–15)
BUN: 69 mg/dL — ABNORMAL HIGH (ref 8–23)
CO2: 27 mmol/L (ref 22–32)
Calcium: 9.2 mg/dL (ref 8.9–10.3)
Chloride: 106 mmol/L (ref 98–111)
Creatinine, Ser: 1.75 mg/dL — ABNORMAL HIGH (ref 0.44–1.00)
GFR, Estimated: 28 mL/min — ABNORMAL LOW (ref >60)
Glucose, Bld: 64 mg/dL — ABNORMAL LOW (ref 70–99)
Potassium: 3.7 mmol/L (ref 3.5–5.1)
Sodium: 144 mmol/L (ref 135–145)
Total Bilirubin: 1.8 mg/dL — ABNORMAL HIGH (ref 0.0–1.2)
Total Protein: 6.2 g/dL — ABNORMAL LOW (ref 6.5–8.1)

## 2024-03-19 LAB — URINALYSIS, ROUTINE W REFLEX MICROSCOPIC
Bilirubin Urine: NEGATIVE
Glucose, UA: NEGATIVE mg/dL
Hgb urine dipstick: NEGATIVE
Ketones, ur: 5 mg/dL — AB
Nitrite: NEGATIVE
Protein, ur: NEGATIVE mg/dL
Specific Gravity, Urine: 1.016 (ref 1.005–1.030)
pH: 5 (ref 5.0–8.0)

## 2024-03-19 LAB — ETHANOL: Alcohol, Ethyl (B): <10 mg/dL (ref <10)

## 2024-03-19 LAB — T4, FREE: Free T4: 2.09 ng/dL — ABNORMAL HIGH (ref 0.61–1.12)

## 2024-03-19 LAB — PROCALCITONIN: Procalcitonin: <0.1 ng/mL

## 2024-03-19 LAB — SALICYLATE LEVEL: Salicylate Lvl: <7 mg/dL — ABNORMAL LOW (ref 7.0–30.0)

## 2024-03-19 LAB — LACTIC ACID, PLASMA: Lactic Acid, Venous: 1 mmol/L (ref 0.5–1.9)

## 2024-03-19 LAB — CREATININE, URINE, RANDOM: Creatinine, Urine: 124 mg/dL

## 2024-03-19 LAB — ACETAMINOPHEN LEVEL: Acetaminophen (Tylenol), Serum: <10 ug/mL — ABNORMAL LOW (ref 10–30)

## 2024-03-19 LAB — SODIUM, URINE, RANDOM: Sodium, Ur: 86 mmol/L

## 2024-03-19 LAB — TSH: TSH: 0.139 u[IU]/mL — ABNORMAL LOW (ref 0.350–4.500)

## 2024-03-19 MED ORDER — SODIUM CHLORIDE 0.9 % IV SOLN
1.0000 g | INTRAVENOUS | Status: DC
  Administered 2024-03-20 – 2024-03-21 (×2): 1 g via INTRAVENOUS
  Filled 2024-03-19 (×3): qty 10

## 2024-03-19 MED ORDER — DEXTROSE 50 % IV SOLN
50.0000 mL | Freq: Once | INTRAVENOUS | Status: AC
  Administered 2024-03-19: 50 mL via INTRAVENOUS

## 2024-03-19 MED ORDER — HEPARIN SODIUM (PORCINE) 5000 UNIT/ML IJ SOLN
5000.0000 [IU] | Freq: Three times a day (TID) | INTRAMUSCULAR | Status: DC
  Administered 2024-03-19 – 2024-03-23 (×11): 5000 [IU] via SUBCUTANEOUS
  Filled 2024-03-19 (×11): qty 1

## 2024-03-19 MED ORDER — LACTATED RINGERS IV SOLN
150.0000 mL/h | INTRAVENOUS | Status: AC
  Administered 2024-03-19 – 2024-03-20 (×3): 150 mL/h via INTRAVENOUS

## 2024-03-19 MED ORDER — MIDODRINE HCL 5 MG PO TABS
10.0000 mg | ORAL_TABLET | Freq: Three times a day (TID) | ORAL | Status: DC
  Administered 2024-03-19 – 2024-03-23 (×11): 10 mg via ORAL
  Filled 2024-03-19 (×12): qty 2

## 2024-03-19 MED ORDER — ENOXAPARIN SODIUM 30 MG/0.3ML IJ SOSY
30.0000 mg | PREFILLED_SYRINGE | INTRAMUSCULAR | Status: DC
Start: 2024-03-19 — End: 2024-03-19

## 2024-03-19 MED ORDER — CEFEPIME HCL 2 G IV SOLR
2.0000 g | Freq: Once | INTRAVENOUS | Status: AC
  Administered 2024-03-19: 2 g via INTRAVENOUS
  Filled 2024-03-19: qty 12.5

## 2024-03-19 MED ORDER — SODIUM CHLORIDE 0.9 % IV BOLUS
2000.0000 mL | Freq: Once | INTRAVENOUS | Status: AC
  Administered 2024-03-19: 2000 mL via INTRAVENOUS

## 2024-03-19 MED ORDER — SODIUM CHLORIDE 0.9 % IV BOLUS
500.0000 mL | Freq: Once | INTRAVENOUS | Status: AC
  Administered 2024-03-19: 500 mL via INTRAVENOUS

## 2024-03-19 MED ORDER — DEXTROSE 50 % IV SOLN
1.0000 | INTRAVENOUS | Status: DC | PRN
  Administered 2024-03-20 (×2): 50 mL via INTRAVENOUS
  Filled 2024-03-19 (×2): qty 50
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
## 2024-03-19 NOTE — Telephone Encounter (Signed)
 Recommend to stop carb/levo for now. -VRP

## 2024-03-19 NOTE — ED Notes (Signed)
 Pt reporting to ED with family d/t increased agitation and confusion at home. Pt is now lethargic, responding to pain. Per outgoing RN, pt has been lethargic for a little while now and has DNR in place. Pt family not at the bedside for further information on pt. Pt pupils 1-2 mm but react to light. Pt connected to cardiac monitoring and pulse oximetry. Pt connected to Bare Hugger to raise core temperature.   Past Medical History:  Diagnosis Date   Arthritis    Blood in stool    saw GI doctor for evaluation   CAD (coronary artery disease)    Cataract    GERD (gastroesophageal reflux disease)    History of colon polyps    adenomatous   Hyperlipidemia    Hypertension    Hypothyroidism    Osteopenia    PSVT (paroxysmal supraventricular tachycardia) (HCC)    RLS (restless legs syndrome)    Thyroid disease    Hypothyroidism   Vitamin D deficiency

## 2024-03-19 NOTE — ED Notes (Signed)
 During rounding of this pt, this RN observed the pt waking up and confused about what was going on. RN explained to pt that she was in the hospital and being taken care of. Pt acknowledged before going back to sleep. Since pt was able to talk and deny any pain, this RN will plan to test swallowing abilities before administration of PO midodrine.
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
## 2024-03-19 NOTE — ED Notes (Signed)
 Pt was able to successfully drink sips of water and swallow BP medication without complication. Pt ABCs intact. RR even and unlabored. Pt in NAD. Bed in lowest locked position. Call bell in reach. Pt complaining of being hot and Bare Hugger turned off, since pt core temperature is WNL.

## 2024-03-19 NOTE — H&P (Signed)
 HISTORY AND PHYSICAL    [name]   ZOX:[id number] DOB: 1935/02/06   Date of Service: 03/19/24 Requesting physician/APP from ED: Treatment Team:  Attending Provider: Sunnie Nielsen, DO  PCP: Adrian Prince, MD     HPI: [name] is a 89 y.o. female with PMH recently diagnosed unspecified parkinsonism (see neurology notes 03/06/2024) associated w/ gait instability and ataxia, falling, tremor, cognitive decline. PMH also includes CAD, HTN, HLD, RLS, hypothyroid, GERD. Past several months she and family have noted fairly rapid progression of gait instability and cognitive decline, though symptoms first began approx a year ago. She has moved in w/ her son who is her primary caretaker, but he reports she is usually fairly well able to perform ADL. Past couple weeks worsening, past 2 days more agitated and paranoid saying her son was trying to kill her, not taking medications, uncertain if fall, brought to ED 03/19/24 via EMS.   In ED, hypothermic, bradycardic, (+)UTI, neg CXR. Started on Cefepime for UTI, also IV fluids, dextrose. Blood cultures collected. EDP confirmed w/ son pt is DNR. Pt somnolent on arrival. Rectal temp 91.6 on arrival, up to 93.9 at time of admission and bradycardia resolved. Admitted to hospitalist service.     Consultants:  none  Procedures: none      ASSESSMENT & PLAN:   Severe sepsis due to UTI ceftriaxone IV fluids  Await urine and blood cultures   Hypothermia d/t sepsis - improving  Bair hugger Frequent vitals  AKI  Question CKD unknown stage - uncertain baseline recent outpatient Cr 1.52 w/ GFR 33 on 02/21/24 On admission, Cr 1.75 Likely d/t sepsis Monitor BMP IV fluids  FeNa  Acute metabolic encephalopathy due to sepsis Complicated by underlying cognitive impairment due to parkinsonism, potential for progressing dementia  Treat underlying cause(s) Fall and delirium precautions  SLP eval when more alert Sitter as  needed  Will not add prn medications at this time, patient is somnolent   Advanced care planning Confirmed DNR.  Son agrees for treating what we can for now, no intubation/CPR Advised son that if she is not cognitively improving after re-warming and few days on antibiotics, especially if unable to eat, given what sounds like progressive rapid decline outpatient would consider hospice care. Son voices understanding.   Hypoglycemia - resolved  Glc check q6h D50 prn   Parkinsonism Hold po meds for now (see neurology note)   Hypothyroidism No recent TSH on record TSH here low 0.139 TSH here, can be abnormal in acute illness so unless T3/T4 are substantially out of range would not alter treatment Po levothyroxine when more alert or consider IV pending T3/T4 results   Abnormal AST, mild elevation Question shock liver Trend CMP  Thrombocytopenia Appears chronic, checked last month was 105 and now is 89 Caution w/ heparin DVT Ppx Monitor CBC  CAD HTN HLD Hold home statin, lisinopril, hydrochlorothiazide for now  RLD Hold home requip for now       DVT prophylaxis: heparin Pertinent IV fluids/nutrition: lactated ringers 150 mL/h x20h Central lines / invasive devices: none.  Code Status: DNR - confirmed on my conversation w/ patient's son this evening.  Family Communication: patient's brother at bedside at time of admission, I also spoke on the phone w/ patient's son [name].   Disposition: inpatient TOC needs: TBD Barriers to discharge / significant pending items: illness as above       Review of Systems:  Unable to obtain d/t patient condition  PMH  has a past medical history of Arthritis, Blood in stool, CAD (coronary artery disease), Cataract, GERD (gastroesophageal reflux disease), History of colon polyps, Hyperlipidemia, Hypertension, Hypothyroidism, Osteopenia, PSVT (paroxysmal supraventricular tachycardia) (HCC), RLS (restless legs syndrome), Thyroid  disease, and Vitamin D deficiency.  No current facility-administered medications on file prior to encounter.   Current Outpatient Medications on File Prior to Encounter  Medication Sig Dispense Refill   Biotin 30865 MCG TABS Take by mouth daily.     calcitRIOL (ROCALTROL) 0.25 MCG capsule Take 0.25 mcg by mouth daily.     Calcium 200 MG TABS Take by mouth daily.     carbidopa-levodopa (SINEMET IR) 25-100 MG tablet Take 1 tablet by mouth 3 (three) times daily before meals. 90 tablet 6   hydrochlorothiazide (HYDRODIURIL) 25 MG tablet Take 25 mg by mouth every morning.      levothyroxine (SYNTHROID, LEVOTHROID) 88 MCG tablet Take 88 mcg by mouth every morning.      lisinopril (PRINIVIL,ZESTRIL) 20 MG tablet Take 20 mg by mouth every morning.      pravastatin (PRAVACHOL) 40 MG tablet Take 40 mg by mouth every morning.      rOPINIRole (REQUIP) 1 MG tablet Take 1 mg by mouth 3 (three) times daily as needed (resltess elgs).        No Known Allergies    family history includes Diabetes in her paternal aunt and paternal grandmother; Heart disease in her brother and father; Hypertension in her father and sister; Lung cancer in her sister; Stroke in her father; Thyroid disease in her mother.    Past Surgical History:  Procedure Laterality Date   CARDIAC CATHETERIZATION     cataract surg Left 2/13   Dr. Charlotte Sanes   DILATION AND CURETTAGE OF UTERUS     x2   heart catherization     07/08   HIP SURGERY  5/12, 1/13   hip out of joint, replaced in OR   JOINT REPLACEMENT     KNEE ARTHROSCOPY Right    LUMBAR LAMINECTOMY  02/17/11   PERIPHERAL IRIDOTOMY     bil eyes   THYROIDECTOMY, PARTIAL  1969   TOTAL HIP ARTHROPLASTY Bilateral 1/04, 4/06   TOTAL KNEE ARTHROPLASTY Right 07/12/2016   Procedure: RIGHT TOTAL KNEE ARTHROPLASTY;  Surgeon: Ollen Gross, MD;  Location: [facility];  Service: Orthopedics;  Laterality: Right;   TUBAL LIGATION            Objective Findings:  Vitals:   03/19/24  1645 03/19/24 1700 03/19/24 1715 03/19/24 1730  BP: (!) 147/66 129/64 (!) 154/73 139/68  Pulse: 73 72 70 75  Resp: 13 10 19 13   Temp: (!) 93 F (33.9 C) (!) 93.2 F (34 C) (!) 93.6 F (34.2 C) (!) 93.9 F (34.4 C)  TempSrc:      SpO2: 97% 97% 97% 97%  Weight:      Height:       No intake or output data in the 24 hours ending 03/19/24 1756 Filed Weights   03/19/24 1215 03/19/24 1218  Weight: 70.5 kg 70 kg    Examination:  Physical Exam Constitutional:      General: She is sleeping. She is not in acute distress.    Appearance: Normal appearance. She is ill-appearing.  HENT:     Head: Normocephalic and atraumatic.  Eyes:     General: Lids are normal.     Conjunctiva/sclera: Conjunctivae normal.     Pupils: Pupils are equal, round, and reactive to  light.  Neck:     Thyroid: No thyroid mass.  Cardiovascular:     Rate and Rhythm: Normal rate and regular rhythm.     Heart sounds: Normal heart sounds.  Pulmonary:     Effort: Pulmonary effort is normal. No accessory muscle usage or respiratory distress.     Breath sounds: Normal breath sounds.     Comments: Not following commands, poor inspiratory effort but no distress  Abdominal:     General: There is no distension.     Palpations: Abdomen is soft.     Tenderness: There is no abdominal tenderness.  Musculoskeletal:     Right lower leg: No edema.     Left lower leg: No edema.  Skin:    General: Skin is dry.     Coloration: Skin is pale.  Neurological:     Mental Status: She is lethargic.          Scheduled Medications:    Continuous Infusions:  lactated ringers      PRN Medications:    Antimicrobials:  Anti-infectives (From admission, onward)    Start     Dose/Rate Route Frequency Ordered Stop   03/19/24 1345  ceFEPIme (MAXIPIME) 2 g in sodium chloride 0.9 % 100 mL IVPB        2 g 200 mL/hr over 30 Minutes Intravenous  Once 03/19/24 1341 03/19/24 1630           Data Reviewed: I have  personally reviewed following labs and imaging studies  CBC: Recent Labs  Lab 03/19/24 1309  WBC 6.3  NEUTROABS 5.2  HGB 13.3  HCT 39.9  MCV 94.3  PLT 89*   Basic Metabolic Panel: Recent Labs  Lab 03/19/24 1309  NA 144  K 3.7  CL 106  CO2 27  GLUCOSE 64*  BUN 69*  CREATININE 1.75*  CALCIUM 9.2   GFR: Estimated Creatinine Clearance: 20.1 mL/min (A) (by C-G formula based on SCr of 1.75 mg/dL (H)). Liver Function Tests: Recent Labs  Lab 03/19/24 1309  AST 72*  ALT 12  ALKPHOS 120  BILITOT 1.8*  PROT 6.2*  ALBUMIN 3.4*   No results for input(s): "LIPASE", "AMYLASE" in the last 168 hours. No results for input(s): "AMMONIA" in the last 168 hours. Coagulation Profile: No results for input(s): "INR", "PROTIME" in the last 168 hours. Cardiac Enzymes: No results for input(s): "CKTOTAL", "CKMB", "CKMBINDEX", "TROPONINI" in the last 168 hours. BNP (last 3 results) No results for input(s): "PROBNP" in the last 8760 hours. HbA1C: No results for input(s): "HGBA1C" in the last 72 hours. CBG: Recent Labs  Lab 03/19/24 1346 03/19/24 1457  GLUCAP 55* 144*   Lipid Profile: No results for input(s): "CHOL", "HDL", "LDLCALC", "TRIG", "CHOLHDL", "LDLDIRECT" in the last 72 hours. Thyroid Function Tests: No results for input(s): "TSH", "T4TOTAL", "FREET4", "T3FREE", "THYROIDAB" in the last 72 hours. Anemia Panel: No results for input(s): "VITAMINB12", "FOLATE", "FERRITIN", "TIBC", "IRON", "RETICCTPCT" in the last 72 hours. Most Recent Urinalysis On File:     Component Value Date/Time   COLORURINE YELLOW (A) 03/19/2024 1309   APPEARANCEUR HAZY (A) 03/19/2024 1309   LABSPEC 1.016 03/19/2024 1309   PHURINE 5.0 03/19/2024 1309   GLUCOSEU NEGATIVE 03/19/2024 1309   HGBUR NEGATIVE 03/19/2024 1309   BILIRUBINUR NEGATIVE 03/19/2024 1309   KETONESUR 5 (A) 03/19/2024 1309   PROTEINUR NEGATIVE 03/19/2024 1309   UROBILINOGEN 0.2 02/16/2011 1430   NITRITE NEGATIVE 03/19/2024 1309    LEUKOCYTESUR SMALL (A) 03/19/2024 1309  Sepsis Labs: @LABRCNTIP (procalcitonin:4,lacticidven:4)  No results found for this or any previous visit (from the past 240 hours).       Radiology Studies: DG Chest Portable 1 View Result Date: 03/19/2024 CLINICAL DATA:  Sepsis EXAM: PORTABLE CHEST 1 VIEW COMPARISON:  09/04/2007 FINDINGS: Mildly diminished lung volumes. Borderline to mild cardiomegaly. Enlarged mediastinal silhouette likely augmented by patient rotation and portable technique. No acute airspace disease, pleural effusion or pneumothorax IMPRESSION: No active disease. Borderline to mild cardiomegaly. Electronically Signed   By: Jasmine Pang M.D.   On: 03/19/2024 15:37             LOS: 0 days      Sunnie Nielsen, DO [facility] 03/19/2024, 5:56 PM    Dictation software may have been used to generate the above note. Typos may occur and escape review in typed/dictated notes. Please contact Dr Lyn Hollingshead directly for clarity if needed.  Staff may message me via secure chat in Epic  but this may not receive an immediate response,  please page me for urgent matters!  If 7PM-7AM, please contact night coverage [email or web address]

## 2024-03-19 NOTE — ED Provider Notes (Signed)
 [facility] Provider Note    Event Date/Time   First MD Initiated Contact with Patient 03/19/24 1209     (approximate)   History   Agitation   HPI  [name] is a 89 y.o. female who presents to the ED for evaluation of Agitation   I review a neurology clinic visit from 3/11, seen for memory loss, gait and speech difficulties.  Overall concerning for parkinsonism.  Patient is brought to the ED from home reportedly due to agitation but when I see her she is quite somnolent and history is limited.  Son reports that patient has had a decline over the past couple months concerning for dementia over the past 2 days has been acutely different, paranoid   Physical Exam   Triage Vital Signs: ED Triage Vitals  Encounter Vitals Group     BP      Systolic BP Percentile      Diastolic BP Percentile      Pulse      Resp      Temp      Temp src      SpO2      Weight      Height      Head Circumference      Peak Flow      Pain Score      Pain Loc      Pain Education      Exclude from Growth Chart     Most recent vital signs: Vitals:   03/19/24 1400 03/19/24 1519  BP: (!) 92/48   Pulse: (!) 46   Resp: 15   Temp:  (!) 91.9 F (33.3 C)  SpO2: 94%     General: Awake, cold, somnolent.  Answer simple yes/no questions CV:  Good peripheral perfusion.  Resp:  Normal effort.  Abd:  No distention.  MSK:  No deformity noted.  Neuro:  No focal deficits appreciated. Other:     ED Results / Procedures / Treatments   Labs (all labs ordered are listed, but only abnormal results are displayed) Labs Reviewed  URINE DRUG SCREEN, QUALITATIVE ([facility] ONLY) - Abnormal; Notable for the following components:      Result Value   Benzodiazepine, Ur Scrn POSITIVE (*)    All other components within normal limits  CBC WITH DIFFERENTIAL/PLATELET - Abnormal; Notable for the following components:   Platelets 89 (*)    All other components within normal  limits  URINALYSIS, ROUTINE W REFLEX MICROSCOPIC - Abnormal; Notable for the following components:   Color, Urine YELLOW (*)    APPearance HAZY (*)    Ketones, ur 5 (*)    Leukocytes,Ua SMALL (*)    Bacteria, UA MANY (*)    All other components within normal limits  COMPREHENSIVE METABOLIC PANEL - Abnormal; Notable for the following components:   Glucose, Bld 64 (*)    BUN 69 (*)    Creatinine, Ser 1.75 (*)    Total Protein 6.2 (*)    Albumin 3.4 (*)    AST 72 (*)    Total Bilirubin 1.8 (*)    GFR, Estimated 28 (*)    All other components within normal limits  ACETAMINOPHEN LEVEL - Abnormal; Notable for the following components:   Acetaminophen (Tylenol), Serum <10 (*)    All other components within normal limits  SALICYLATE LEVEL - Abnormal; Notable for the following components:   Salicylate Lvl <7.0 (*)    All other components within  normal limits  CBG MONITORING, ED - Abnormal; Notable for the following components:   Glucose-Capillary 55 (*)    All other components within normal limits  CBG MONITORING, ED - Abnormal; Notable for the following components:   Glucose-Capillary 144 (*)    All other components within normal limits  CULTURE, BLOOD (ROUTINE X 2)  CULTURE, BLOOD (ROUTINE X 2)  LACTIC ACID, PLASMA  PROCALCITONIN  ETHANOL  LACTIC ACID, PLASMA    EKG Sinus rhythm with a rate of 63 bpm.  First-degree AV block with PR interval 244, no evidence of high-grade block.  1 PVC.  No clear signs of acute ischemia.  RADIOLOGY   Official radiology report(s): No results found.  PROCEDURES and INTERVENTIONS:  .Critical Care  Performed by: Delton Prairie, MD Authorized by: Delton Prairie, MD   Critical care provider statement:    Critical care time (minutes):  30   Critical care time was exclusive of:  Separately billable procedures and treating other patients   Critical care was necessary to treat or prevent imminent or life-threatening deterioration of the following  conditions:  Sepsis   Critical care was time spent personally by me on the following activities:  Development of treatment plan with patient or surrogate, discussions with consultants, evaluation of patient's response to treatment, examination of patient, ordering and review of laboratory studies, ordering and review of radiographic studies, ordering and performing treatments and interventions, pulse oximetry, re-evaluation of patient's condition and review of old charts .1-3 Lead EKG Interpretation  Performed by: Delton Prairie, MD Authorized by: Delton Prairie, MD     Interpretation: abnormal     ECG rate:  50   ECG rate assessment: bradycardic     Rhythm: sinus bradycardia     Ectopy: none     Conduction: normal     Medications  ceFEPIme (MAXIPIME) 2 g in sodium chloride 0.9 % 100 mL IVPB (has no administration in time range)  sodium chloride 0.9 % bolus 2,000 mL (0 mLs Intravenous Stopped 03/19/24 1514)  dextrose 50 % solution 50 mL (50 mLs Intravenous Given 03/19/24 1347)     IMPRESSION / MDM / ASSESSMENT AND PLAN / ED COURSE  I reviewed the triage vital signs and the nursing notes.  Differential diagnosis includes, but is not limited to, ICH, CVA, polysubstance abuse, overdose, sepsis  {Patient presents with symptoms of an acute illness or injury that is potentially life-threatening.  Patient presents to the ED altered.  Initial concern for agitation at home but here she is quite the opposite and is somnolent.  I am most concerned about sepsis or metabolic encephalopathy considering her presenting core temperature of 91 F and soft blood pressures.  She is responsive to fluids and does not require pressors at this point.  Her vital signs are most concerning but her blood work shows a normal WBC.  We will send cultures and start antibiotics considering her abnormal urinalysis in the setting of her vital sign derangements.  I suspect she will require admission to the hospital pending a CT  scan of the head  Clinical Course as of 03/19/24 1533  Mon Mar 19, 2024  1236 I am informed of the low rectal temperature.  Updated nurse on diagnostics, expanding to sepsis screening criteria [DS]  1340 I call her son, [name]. Last 2 days thinking that he was plotting to kill her. No falls recently, but found her on the floor "hiding from you," when concerned that he would kill her. He recalls  a fall 1 month ago.   Clarified CODE STATUS and he confirms that she would not want to be resuscitated, I conveyed DNR status to the nursing staff as well. [DS]    Clinical Course User Index [DS] Delton Prairie, MD     FINAL CLINICAL IMPRESSION(S) / ED DIAGNOSES   Final diagnoses:  Hypothermia, initial encounter  Altered mental status, unspecified altered mental status type     Rx / DC Orders   ED Discharge Orders     None        Note:  This document was prepared using Dragon voice recognition software and may include unintentional dictation errors.   Delton Prairie, MD 03/19/24 [id number]

## 2024-03-19 NOTE — ED Notes (Signed)
 Pt still lethargic at this time, only responding temporarily to pain. Pt ABCs intact. RR even and unlabored. Pt still bradycardic and hypotensive. Bed in lowest locked position. Call bell in reach. Denies needs at this time.

## 2024-03-19 NOTE — ED Triage Notes (Signed)
 Pt to ED via [facility] from home for agitation. Family states she has been more agitated x1 month. Pt has dx with parkinsons and dementia in march. Per EMS, pt was agitated in route and EMS reports HR 48 SB.  EMS gave 0.5 atropine IV. Last BP 142/76 , HR 82 bpm.    EMS vitals  HR 82 BP 142/76 SPO2 95%  RR 18  CBG 75

## 2024-03-20 ENCOUNTER — Encounter: Payer: Self-pay | Admitting: Osteopathic Medicine

## 2024-03-20 DIAGNOSIS — G9341 Metabolic encephalopathy: Secondary | ICD-10-CM | POA: Diagnosis not present

## 2024-03-20 DIAGNOSIS — A419 Sepsis, unspecified organism: Secondary | ICD-10-CM | POA: Diagnosis not present

## 2024-03-20 DIAGNOSIS — R6520 Severe sepsis without septic shock: Secondary | ICD-10-CM | POA: Diagnosis not present

## 2024-03-20 LAB — AUTOIMMUNE NEUROLOGY AB

## 2024-03-20 LAB — VITAMIN E
Vitamin E (Alpha Tocopherol): 8 mg/L — ABNORMAL LOW (ref 9.0–29.0)
Vitamin E(Gamma Tocopherol): 1.4 mg/L (ref 0.5–4.9)

## 2024-03-20 LAB — BASIC METABOLIC PANEL
Anion gap: 10 (ref 5–15)
BUN: 57 mg/dL — ABNORMAL HIGH (ref 8–23)
CO2: 25 mmol/L (ref 22–32)
Calcium: 8.1 mg/dL — ABNORMAL LOW (ref 8.9–10.3)
Chloride: 109 mmol/L (ref 98–111)
Creatinine, Ser: 1.51 mg/dL — ABNORMAL HIGH (ref 0.44–1.00)
GFR, Estimated: 33 mL/min — ABNORMAL LOW (ref >60)
Glucose, Bld: 65 mg/dL — ABNORMAL LOW (ref 70–99)
Potassium: 3.3 mmol/L — ABNORMAL LOW (ref 3.5–5.1)
Sodium: 144 mmol/L (ref 135–145)

## 2024-03-20 LAB — CBC
HCT: 30.9 % — ABNORMAL LOW (ref 36.0–46.0)
Hemoglobin: 10.4 g/dL — ABNORMAL LOW (ref 12.0–15.0)
MCH: 32.1 pg (ref 26.0–34.0)
MCHC: 33.7 g/dL (ref 30.0–36.0)
MCV: 95.4 fL (ref 80.0–100.0)
Platelets: 87 10*3/uL — ABNORMAL LOW (ref 150–400)
RBC: 3.24 MIL/uL — ABNORMAL LOW (ref 3.87–5.11)
RDW: 15.2 % (ref 11.5–15.5)
WBC: 5.3 10*3/uL (ref 4.0–10.5)
nRBC: 0 % (ref 0.0–0.2)

## 2024-03-20 LAB — CBG MONITORING, ED
Glucose-Capillary: 168 mg/dL — ABNORMAL HIGH (ref 70–99)
Glucose-Capillary: 58 mg/dL — ABNORMAL LOW (ref 70–99)
Glucose-Capillary: 60 mg/dL — ABNORMAL LOW (ref 70–99)

## 2024-03-20 LAB — GLUCOSE, CAPILLARY
Glucose-Capillary: 72 mg/dL (ref 70–99)
Glucose-Capillary: 114 mg/dL — ABNORMAL HIGH (ref 70–99)

## 2024-03-20 LAB — CORTISOL-AM, BLOOD: Cortisol - AM: 10.5 ug/dL (ref 6.7–22.6)

## 2024-03-20 LAB — PROTIME-INR
INR: 1.2 (ref 0.8–1.2)
Prothrombin Time: 15 s (ref 11.4–15.2)

## 2024-03-20 LAB — VITAMIN B12: Vitamin B-12: 802 pg/mL (ref 232–1245)

## 2024-03-20 MED ORDER — ORAL CARE MOUTH RINSE: 15.0000 mL | OROMUCOSAL | Status: DC | PRN

## 2024-03-20 MED ORDER — CARBIDOPA-LEVODOPA 25-100 MG PO TABS
1.0000 | ORAL_TABLET | Freq: Three times a day (TID) | ORAL | Status: DC
  Administered 2024-03-20 – 2024-03-23 (×9): 1 via ORAL
  Filled 2024-03-20 (×9): qty 1

## 2024-03-20 MED ORDER — ENSURE ENLIVE PO LIQD
237.0000 mL | Freq: Two times a day (BID) | ORAL | Status: DC
  Administered 2024-03-20 – 2024-03-23 (×7): 237 mL via ORAL

## 2024-03-20 MED ORDER — CHLORHEXIDINE GLUCONATE CLOTH 2 % EX PADS
6.0000 | MEDICATED_PAD | Freq: Every day | CUTANEOUS | Status: DC
  Administered 2024-03-20 – 2024-03-21 (×2): 6 via TOPICAL

## 2024-03-20 MED ORDER — ROPINIROLE HCL 1 MG PO TABS: 1.0000 mg | ORAL_TABLET | Freq: Three times a day (TID) | ORAL | Status: DC | PRN

## 2024-03-20 NOTE — Progress Notes (Signed)
 PROGRESS NOTE    [name]   NUU:[id number] DOB: Oct 01, 1935  DOA: 03/19/2024 Date of Service: 03/20/24 which is hospital day 1  PCP: Adrian Prince, MD    Hospital course / significant events:   HPI: [name] is a 89 y.o. female with PMH recently diagnosed unspecified parkinsonism (see neurology notes 03/06/2024) associated w/ gait instability and ataxia, falling, tremor, cognitive decline. PMH also includes CAD, HTN, HLD, RLS, hypothyroid, GERD. Past several months she and family have noted fairly rapid progression of gait instability and cognitive decline, though symptoms first began approx a year ago. She has moved in w/ her son who is her primary caretaker, but he reports she is usually fairly well able to perform ADL. Past couple weeks worsening, past 2 days more agitated and paranoid saying her son was trying to kill her, not taking medications, uncertain if fall, brought to ED 03/19/24 via EMS.  03/24: In ED, hypothermic, bradycardic, (+)UTI, neg CXR. Started on Cefepime for UTI, also IV fluids, dextrose. Blood cultures collected. Pt somnolent on arrival, not responsive to sternal rub. EDP confirmed w/ son pt is DNR. Rectal temp 91.6 on arrival, up to 93.9 at time of admission and bradycardia resolved. Admitted to hospitalist service.  03/25: mental status improved, still low temp and HR but trending favorably.      Consultants:  none  Procedures/Surgeries: none      ASSESSMENT & PLAN:   Severe sepsis due to UTI ceftriaxone IV fluids - can d/c when taking po  Await urine and blood cultures    Hypothermia d/t sepsis - improving  Bair hugger until core temp at least 97 Vitals q4h   Prerenal AKI likely d/t sepsis/dehydration - improving Question CKD unknown stage - uncertain baseline recent outpatient Cr 1.52 w/ GFR 33 on 02/21/24 On admission, Cr 1.75 Likely d/t sepsis Monitor BMP IV fluids can d/c once taking po    Acute metabolic encephalopathy  due to sepsis - improved  Complicated by underlying cognitive impairment due to parkinsonism, potential for progressing dementia  Treat underlying cause(s) Fall and delirium precautions    Advanced care planning Confirmed DNR.  Son agrees for treating what we can for now, no intubation/CPR   Hypoglycemia - resolved  Restart diet as able   Parkinsonism Resume home sinemet    Hypothyroidism No recent TSH on record TSH here low 0.139, T4 elevated TSH here, can be abnormal in acute illness so unless T3/T4 are substantially out of range would not alter treatment Given high T4 will hold Synthroid for now  Pt also taking biotin which can affect thyroid lab assays Recheck TSH/T4/T3 after at least 3 days off biotin    Abnormal AST, mild elevation Question shock liver Trend CMP   Thrombocytopenia Appears chronic, checked last month was 105 and now is 89 Caution w/ heparin DVT Ppx Monitor CBC   CAD HTN HLD Hold home statin, lisinopril, hydrochlorothiazide for now   RLD resume home requip      Obesity Class 1 based on BMI: Body mass index is 30.14 kg/m.  Underweight - under 18  overweight - 25 to 29 obese - 30 or more Class 1 obesity: BMI of 30.0 to 34 Class 2 obesity: BMI of 35.0 to 39 Class 3 obesity: BMI of 40.0 to 49 Super Morbid Obesity: BMI 50-59 Super-super Morbid Obesity: BMI 60+ Significantly low or high BMI is associated with higher medical risk.  Weight management advised as adjunct to other disease management and  risk reduction treatments    DVT prophylaxis: heparin given renal function IV fluids: have d/c continuous IV fluids  Nutrition: regular diet  Central lines / other devices: Foley w/ temperature monitor for accute core temp and strict I&O  Code Status: DNR ACP documentation reviewed: none on file in [facility]  TOC needs: TBD Medical barriers to dispo: hypothermia, bradycardia, awaiting cultures. Expected medical readiness for discharge next 1-2  days.              Subjective / Brief ROS:  Patient reports "I feel fine, it's too hot." She is oriented to place, year/month, self, not to situation but was somnolent on arrival  Denies CP/SOB.  Pain controlled.  Denies new weakness.  Tolerating diet per RN.  Reports no concerns w/ urination/defecation prior to hospitalization.   Family Communication: called son 03/20/24 1:29 PM all questions answered     Objective Findings:  Vitals:   03/20/24 0745 03/20/24 0800 03/20/24 0823 03/20/24 1126  BP: 130/81 (!) 153/79 126/76 130/74  Pulse: (!) 51 (!) 42 (!) 45   Resp: 14 17 16 14   Temp: (!) 95.4 F (35.2 C) (!) 95.2 F (35.1 C)  (!) 95.7 F (35.4 C)  TempSrc:    Bladder  SpO2: 97% 97% 95% 97%  Weight:      Height:        Intake/Output Summary (Last 24 hours) at 03/20/2024 1327 Last data filed at 03/20/2024 0046 Gross per 24 hour  Intake 598.08 ml  Output --  Net 598.08 ml   Filed Weights   03/19/24 1215 03/19/24 1218  Weight: 70.5 kg 70 kg    Examination:  Physical Exam Constitutional:      General: She is not in acute distress. Cardiovascular:     Rate and Rhythm: Regular rhythm. Bradycardia present.  Pulmonary:     Effort: Pulmonary effort is normal.     Breath sounds: Normal breath sounds.  Abdominal:     General: Abdomen is flat. Bowel sounds are normal.     Palpations: Abdomen is soft.  Musculoskeletal:     Right lower leg: No edema.     Left lower leg: No edema.  Skin:    General: Skin is warm and dry.  Neurological:     General: No focal deficit present.     Mental Status: She is alert and oriented to person, place, and time.  Psychiatric:        Mood and Affect: Mood normal.        Behavior: Behavior normal.          Scheduled Medications:   carbidopa-levodopa  1 tablet Oral TID AC   feeding supplement  237 mL Oral BID BM   heparin injection (subcutaneous)  5,000 Units Subcutaneous Q8H   midodrine  10 mg Oral TID WC     Continuous Infusions:  cefTRIAXone (ROCEPHIN)  IV Stopped (03/20/24 0046)   lactated ringers Stopped (03/20/24 1103)    PRN Medications:  dextrose, mouth rinse, rOPINIRole  Antimicrobials from admission:  Anti-infectives (From admission, onward)    Start     Dose/Rate Route Frequency Ordered Stop   03/20/24 0000  cefTRIAXone (ROCEPHIN) 1 g in sodium chloride 0.9 % 100 mL IVPB        1 g 200 mL/hr over 30 Minutes Intravenous Every 24 hours 03/19/24 1835     03/19/24 1345  ceFEPIme (MAXIPIME) 2 g in sodium chloride 0.9 % 100 mL IVPB  2 g 200 mL/hr over 30 Minutes Intravenous  Once 03/19/24 1341 03/19/24 1630           Data Reviewed:  I have personally reviewed the following...  CBC: Recent Labs  Lab 03/19/24 1309 03/20/24 0655  WBC 6.3 5.3  NEUTROABS 5.2  --   HGB 13.3 10.4*  HCT 39.9 30.9*  MCV 94.3 95.4  PLT 89* 87*   Basic Metabolic Panel: Recent Labs  Lab 03/19/24 1309 03/20/24 0655  NA 144 144  K 3.7 3.3*  CL 106 109  CO2 27 25  GLUCOSE 64* 65*  BUN 69* 57*  CREATININE 1.75* 1.51*  CALCIUM 9.2 8.1*   GFR: Estimated Creatinine Clearance: 23.3 mL/min (A) (by C-G formula based on SCr of 1.51 mg/dL (H)). Liver Function Tests: Recent Labs  Lab 03/19/24 1309  AST 72*  ALT 12  ALKPHOS 120  BILITOT 1.8*  PROT 6.2*  ALBUMIN 3.4*   No results for input(s): "LIPASE", "AMYLASE" in the last 168 hours. No results for input(s): "AMMONIA" in the last 168 hours. Coagulation Profile: Recent Labs  Lab 03/20/24 0655  INR 1.2   Cardiac Enzymes: No results for input(s): "CKTOTAL", "CKMB", "CKMBINDEX", "TROPONINI" in the last 168 hours. BNP (last 3 results) No results for input(s): "PROBNP" in the last 8760 hours. HbA1C: No results for input(s): "HGBA1C" in the last 72 hours. CBG: Recent Labs  Lab 03/19/24 1457 03/20/24 0249 03/20/24 0321 03/20/24 0659 03/20/24 1209  GLUCAP 144* 58* 168* 60* 72   Lipid Profile: No results for  input(s): "CHOL", "HDL", "LDLCALC", "TRIG", "CHOLHDL", "LDLDIRECT" in the last 72 hours. Thyroid Function Tests: Recent Labs    03/19/24 1309 03/19/24 1324  TSH 0.139*  --   FREET4  --  2.09*   Anemia Panel: No results for input(s): "VITAMINB12", "FOLATE", "FERRITIN", "TIBC", "IRON", "RETICCTPCT" in the last 72 hours. Most Recent Urinalysis On File:     Component Value Date/Time   COLORURINE YELLOW (A) 03/19/2024 1309   APPEARANCEUR HAZY (A) 03/19/2024 1309   LABSPEC 1.016 03/19/2024 1309   PHURINE 5.0 03/19/2024 1309   GLUCOSEU NEGATIVE 03/19/2024 1309   HGBUR NEGATIVE 03/19/2024 1309   BILIRUBINUR NEGATIVE 03/19/2024 1309   KETONESUR 5 (A) 03/19/2024 1309   PROTEINUR NEGATIVE 03/19/2024 1309   UROBILINOGEN 0.2 02/16/2011 1430   NITRITE NEGATIVE 03/19/2024 1309   LEUKOCYTESUR SMALL (A) 03/19/2024 1309   Sepsis Labs: @LABRCNTIP (procalcitonin:4,lacticidven:4) Microbiology: No results found for this or any previous visit (from the past 240 hours).    Radiology Studies last 3 days: CT HEAD WO CONTRAST ( ) Result Date: 03/19/2024 CLINICAL DATA:  Agitation EXAM: CT HEAD WITHOUT CONTRAST TECHNIQUE: Contiguous axial images were obtained from the base of the skull through the vertex without intravenous contrast. RADIATION DOSE REDUCTION: This exam was performed according to the departmental dose-optimization program which includes automated exposure control, adjustment of the mA and/or kV according to patient size and/or use of iterative reconstruction technique. COMPARISON:  MRI 02/21/2024 FINDINGS: Brain: No acute territorial infarction, hemorrhage or intracranial mass. Mild atrophy. Normal ventricle size Vascular: No hyperdense vessel or unexpected calcification. Skull: Normal. Negative for fracture or focal lesion. Sinuses/Orbits: No acute finding. Other: None IMPRESSION: No CT evidence for acute intracranial abnormality. Mild atrophy. Electronically Signed   By: Jasmine Pang M.D.    On: 03/19/2024 18:36   DG Chest Portable 1 View Result Date: 03/19/2024 CLINICAL DATA:  Sepsis EXAM: PORTABLE CHEST 1 VIEW COMPARISON:  09/04/2007 FINDINGS: Mildly diminished lung volumes. Borderline to  mild cardiomegaly. Enlarged mediastinal silhouette likely augmented by patient rotation and portable technique. No acute airspace disease, pleural effusion or pneumothorax IMPRESSION: No active disease. Borderline to mild cardiomegaly. Electronically Signed   By: Jasmine Pang M.D.   On: 03/19/2024 15:37       Time spent: 50 min     Sunnie Nielsen, DO [facility] 03/20/2024, 1:27 PM    Dictation software may have been used to generate the above note. Typos may occur and escape review in typed/dictated notes. Please contact Dr Lyn Hollingshead directly for clarity if needed.  Staff may message me via secure chat in Epic  but this may not receive an immediate response,  please page me for urgent matters!  If 7PM-7AM, please contact night coverage [email or web address]

## 2024-03-20 NOTE — Hospital Course (Signed)
 Hospital course / significant events:   HPI: [name] is a 89 y.o. female with PMH recently diagnosed unspecified parkinsonism (see neurology notes 03/06/2024) associated w/ gait instability and ataxia, falling, tremor, cognitive decline. PMH also includes CAD, HTN, HLD, RLS, hypothyroid, GERD. Past several months she and family have noted fairly rapid progression of gait instability and cognitive decline, though symptoms first began approx a year ago. She has moved in w/ her son who is her primary caretaker, but he reports she is usually fairly well able to perform ADL. Past couple weeks worsening, past 2 days more agitated and paranoid saying her son was trying to kill her, not taking medications, uncertain if fall, brought to ED 03/19/24 via EMS.  03/24: In ED, hypothermic, bradycardic, (+)UTI, neg CXR. Started on Cefepime for UTI, also IV fluids, dextrose. Blood cultures collected. Pt somnolent on arrival, not responsive to sternal rub. EDP confirmed w/ son pt is DNR. Rectal temp 91.6 on arrival, up to 93.9 at time of admission and bradycardia resolved. Admitted to hospitalist service.  03/25: mental status improved, still low temp and HR but trending favorably.      Consultants:  none  Procedures/Surgeries: none      ASSESSMENT & PLAN:   Severe sepsis due to UTI ceftriaxone IV fluids - can d/c when taking po  Await urine and blood cultures    Hypothermia d/t sepsis - improving  Bair hugger until core temp at least 97 Vitals q4h   Prerenal AKI likely d/t sepsis/dehydration - improving Question CKD unknown stage - uncertain baseline recent outpatient Cr 1.52 w/ GFR 33 on 02/21/24 On admission, Cr 1.75 Likely d/t sepsis Monitor BMP IV fluids can d/c once taking po    Acute metabolic encephalopathy due to sepsis - improved  Complicated by underlying cognitive impairment due to parkinsonism, potential for progressing dementia  Treat underlying cause(s) Fall and  delirium precautions    Advanced care planning Confirmed DNR.  Son agrees for treating what we can for now, no intubation/CPR   Hypoglycemia - resolved  Restart diet as able   Parkinsonism Resume home sinemet    Hypothyroidism No recent TSH on record TSH here low 0.139, T4 elevated TSH here, can be abnormal in acute illness so unless T3/T4 are substantially out of range would not alter treatment Given high T4 will hold Synthroid for now  Pt also taking biotin which can affect thyroid lab assays Recheck TSH/T4/T3 after at least 3 days off biotin    Abnormal AST, mild elevation Question shock liver Trend CMP   Thrombocytopenia Appears chronic, checked last month was 105 and now is 89 Caution w/ heparin DVT Ppx Monitor CBC   CAD HTN HLD Hold home statin, lisinopril, hydrochlorothiazide for now   RLD resume home requip      Obesity Class 1 based on BMI: Body mass index is 30.14 kg/m.  Underweight - under 18  overweight - 25 to 29 obese - 30 or more Class 1 obesity: BMI of 30.0 to 34 Class 2 obesity: BMI of 35.0 to 39 Class 3 obesity: BMI of 40.0 to 49 Super Morbid Obesity: BMI 50-59 Super-super Morbid Obesity: BMI 60+ Significantly low or high BMI is associated with higher medical risk.  Weight management advised as adjunct to other disease management and risk reduction treatments    DVT prophylaxis: heparin given renal function IV fluids: have d/c continuous IV fluids  Nutrition: regular diet  Central lines / other devices: Foley w/ temperature monitor  for accute core temp and strict I&O  Code Status: DNR ACP documentation reviewed: none on file in [facility]  TOC needs: TBD Medical barriers to dispo: hypothermia, bradycardia, awaiting cultures. Expected medical readiness for discharge next 1-2 days.

## 2024-03-20 NOTE — Evaluation (Signed)
 Occupational Therapy Evaluation Patient Details Name: [name] MRN: [id number] DOB: 10/19/1935 Today's Date: 03/20/2024   History of Present Illness   Pt is an 89 y.o. female who presents to ED via EMS d/t family concern of worsening cognition and weakness. Admitted with severe sepsis d/t UTI, hypothermia, AKI. PMH recently diagnosed unspecified parkinsonism (see neurology notes 03/06/2024) associated w/ gait instability and ataxia, falling, tremor, cognitive decline. PMH also includes CAD, HTN, HLD, RLS, hypothyroid, GERD.     Clinical Impressions Pt was seen for OT evaluation this date. Prior to hospital admission, pt was living with her son, DIL and granddaughter in a one level home with 2 STE and 1 HR. Son reports prior to 2 months ago, pt was able to drive and ambulate household distances with no AD use. Had an Art gallery manager for community distances. She was IND/MOD I with ADL performance. Pt began using SPC 2 months ago, then recently switched to a walker d/t 5-6 falls in the last month. Son and his family had been providing 24/7 supervision and assist the last few weeks d/t her falls/balance deficits.  Pt presents to acute OT demonstrating impaired ADL performance and functional mobility 2/2 weakness and balance deficits. Pt alert and oriented to person, place, time but unsure of situation. Admits to multiple recent falls. Needed Min/Mod A for LB dressing tasks. CGA for STS from recliner to RW and CGA to ambulate ~75 feet into hallway with handoff to PT. VSS. OT placed call to son to gather home set up information as pt unable to provide this.  Pt would benefit from skilled OT services to address noted impairments and functional limitations (see below for any additional details) in order to maximize safety and independence while minimizing falls risk and caregiver burden. Do anticipate the need for follow up OT services upon acute hospital DC.      If plan is discharge home,  recommend the following:   A little help with walking and/or transfers;A lot of help with bathing/dressing/bathroom;Direct supervision/assist for medications management;Supervision due to cognitive status;Direct supervision/assist for financial management;Assistance with cooking/housework;Assist for transportation;Help with stairs or ramp for entrance     Functional Status Assessment   Patient has had a recent decline in their functional status and demonstrates the ability to make significant improvements in function in a reasonable and predictable amount of time.     Equipment Recommendations   Tub/shower seat;BSC/3in1     Recommendations for Other Services         Precautions/Restrictions         Mobility Bed Mobility               General bed mobility comments: NT up in recliner on arrival    Transfers Overall transfer level: Needs assistance Equipment used: Rolling walker (2 wheels) Transfers: Sit to/from Stand Sit to Stand: Contact guard assist           General transfer comment: CGA/SBA for STS from recliner, CGA with RW use to ambulate from recliner out to hallway with handoff to PT      Balance Overall balance assessment: Needs assistance Sitting-balance support: Feet supported Sitting balance-Leahy Scale: Good Sitting balance - Comments: no LOB while seated in recliner for LB dressing tasks   Standing balance support: Reliant on assistive device for balance, Bilateral upper extremity supported Standing balance-Leahy Scale: Fair Standing balance comment: RW use and CGA  ADL either performed or assessed with clinical judgement   ADL Overall ADL's : Needs assistance/impaired                     Lower Body Dressing: Minimal assistance;Sitting/lateral leans;Moderate assistance Lower Body Dressing Details (indicate cue type and reason): Min/Mod A to doff/don socks seated via lateral leans from  recliner             Functional mobility during ADLs: Contact guard assist;Minimal assistance;Rolling walker (2 wheels)       Vision         Perception         Praxis         Pertinent Vitals/Pain       Extremity/Trunk Assessment Upper Extremity Assessment Upper Extremity Assessment: Overall WFL for tasks assessed   Lower Extremity Assessment Lower Extremity Assessment: Generalized weakness       Communication Communication Communication: No apparent difficulties   Cognition Arousal: Alert Behavior During Therapy: Flat affect Cognition: Cognition impaired   Orientation impairments: Person, Place, Time                           Following commands: Intact       Cueing  General Comments      VSS stable   Exercises Other Exercises Other Exercises: Edu on role of OT in acute setting. Other Exercises: Spoke with son via phone to gather history/home set up information.   Shoulder Instructions      Home Living Family/patient expects to be discharged to:: Private residence Living Arrangements: Children (son, and DIL and 31 year old granddaughter) Available Help at Discharge: Available 24 hours/day;Family Type of Home: House Home Access: Stairs to enter [employer] of Steps: 2 STE, R side going up Entrance Stairs-Rails: Right Home Layout: One level     Bathroom Shower/Tub: Chief Strategy Officer: Handicapped height     Home Equipment: Insurance risk surveyor (2 wheels);Cane - single point;Grab bars - tub/shower;Grab bars - toilet          Prior Functioning/Environment Prior Level of Function : Driving;Independent/Modified Independent;History of Falls (last six months)             Mobility Comments: IND household distances, starting with cane 2 months ago; 5-6 falls in last month ADLs Comments: IND prior to last few weeks    OT Problem List: Decreased strength;Impaired balance (sitting and/or  standing);Decreased activity tolerance;Decreased safety awareness   OT Treatment/Interventions: Self-care/ADL training;Therapeutic exercise;Therapeutic activities;Patient/family education;Balance training;DME and/or AE instruction      OT Goals(Current goals can be found in the care plan section)   Acute Rehab OT Goals Patient Stated Goal: get better OT Goal Formulation: With patient Time For Goal Achievement: 04/03/24 Potential to Achieve Goals: Good ADL Goals Pt Will Perform Lower Body Bathing: with contact guard assist;with supervision;sitting/lateral leans;sit to/from stand Pt Will Perform Lower Body Dressing: with contact guard assist;with supervision;sit to/from stand;sitting/lateral leans Pt Will Transfer to Toilet: with contact guard assist;with supervision;ambulating;regular height toilet;grab bars Pt Will Perform Toileting - Clothing Manipulation and hygiene: with supervision;sit to/from stand;sitting/lateral leans   OT Frequency:  Min 2X/week    Co-evaluation              AM-PAC OT "6 Clicks" Daily Activity     Outcome Measure Help from another person eating meals?: None Help from another person taking care of personal grooming?: None Help from another person toileting,  which includes using toliet, bedpan, or urinal?: A Little Help from another person bathing (including washing, rinsing, drying)?: A Lot Help from another person to put on and taking off regular upper body clothing?: A Little Help from another person to put on and taking off regular lower body clothing?: A Lot 6 Click Score: 18   End of Session Equipment Utilized During Treatment: Gait belt;Rolling walker (2 wheels) Nurse Communication: Mobility status  Activity Tolerance: Patient tolerated treatment well Patient left:  (handoff to PT)  OT Visit Diagnosis: Other abnormalities of gait and mobility (R26.89);Repeated falls (R29.6);Unsteadiness on feet (R26.81)                Time: 1610-9604 OT  Time Calculation (min): 17 min Charges:  OT General Charges $OT Visit: 1 Visit OT Evaluation $OT Eval Moderate Complexity: 1 Mod Isidore Margraf, OTR/L  03/20/24, 2:20 PM  Elisa Sorlie E Goerge Mohr 03/20/2024, 2:17 PM

## 2024-03-20 NOTE — ED Notes (Signed)
 Pt informed that if her core temperature starts to decrease again, she will likely have to go back under the [employer]. Pt stated, " I don't wanna go back under that thing."

## 2024-03-20 NOTE — Telephone Encounter (Signed)
 Pt was admitted to hospital and is being worked up for these concerns.

## 2024-03-20 NOTE — Progress Notes (Signed)
 MEWS Progress Note  Patient Details Name: [name] MRN: [id number] DOB: [birth year] Today's Date: 03/20/2024   MEWS Flowsheet Documentation:  Assess: MEWS Score Temp:  (unable to read under tongue or armpit) BP: 126/76 MAP (mmHg): 92 Pulse Rate: (!) 45 ECG Heart Rate: (!) 47 Resp: 16 Level of Consciousness: Alert SpO2: 95 % O2 Device: Room Air Assess: MEWS Score MEWS Temp: 1 MEWS Systolic: 0 MEWS Pulse: 1 MEWS RR: 0 MEWS LOC: 0 MEWS Score: 2 MEWS Score Color: Yellow Assess: SIRS CRITERIA SIRS Temperature : 1 SIRS Respirations : 0 SIRS Pulse: 0 SIRS WBC: 0 SIRS Score Sum : 1 SIRS Temperature : 1 SIRS Pulse: 0 SIRS Respirations : 0 SIRS WBC: 0 SIRS Score Sum : 1 Assess: if the MEWS score is Yellow or Red Were vital signs accurate and taken at a resting state?: Yes Does the patient meet 2 or more of the SIRS criteria?: Yes Does the patient have a confirmed or suspected source of infection?: Yes Notify: Charge Nurse/RN Name of Charge Nurse/RN Notified: Blair Heys Provider Notification Provider Name/Title: dr. Lyn Hollingshead Date Provider Notified: 03/20/24 Time Provider Notified: [phone] Method of Notification: Page Notification Reason: Change in status Provider response: Other (Comment) (awaiting orders) Date of Provider Response: 03/20/24 Time of Provider Response: 0850 Notify: Rapid Response Name of Rapid Response RN Notified: none, rn Date Rapid Response Notified: 03/20/24 Time Rapid Response Notified: [id number]      Laqueta Due 03/20/2024, 8:50 AM

## 2024-03-20 NOTE — Progress Notes (Signed)
 Mobility Specialist - Progress Note   03/20/24 1200  Mobility  Activity Ambulated with assistance in hallway;Ambulated with assistance in room  Level of Assistance Minimal assist, patient does 75% or more  Assistive Device Front wheel walker  Distance Ambulated (ft) 75 ft  Activity Response Tolerated well  Mobility visit 1 Mobility     Nurse requested Mobility Specialist to perform oxygen saturation test with pt which includes removing pt from oxygen both at rest and while ambulating.  Below are the results from that testing.     O2 while resting on RA = 95%  O2 while AMB on RA = 91%  O2 while AMB on ?L = N/A   Pt sitting in recliner upon arrival, utilizing RA. Pt agreeable to activity. Flat affect. STS and ambulated in hallway with minA. Poor safety awareness. RW negotiation to avoid obstacle collision. VC to stay close/inside RW. Mild LOB x1 recovered with CGA. Pt left in chair with alarm set, needs in reach. Reported results to nurse.   Filiberto Pinks Mobility Specialist 03/20/24, 12:23 PM

## 2024-03-20 NOTE — Evaluation (Signed)
 Physical Therapy Evaluation Patient Details Name: [name] MRN: [id number] DOB: 02/20/1935 Today's Date: 03/20/2024  History of Present Illness  Pt is an 89 y.o. female presenting to hospital 03/19/24 with c/o agitation; per chart pt has had a decline over past couple months.  PMH includes recently diagnosed unspecified parkinsonism associated with gait instability and ataxia, falling, tremor, and cognitive decline.  Pt admitted with severe sepsis d/t UTI, hypothermia, AKI, acute metabolic encephalopathy d/t sepsis. PMH also includes CAD, htn, HLD, RLS.  Clinical Impression  Prior to recent medical concerns, pt was ambulatory; h/o falls; lives with family.  Pt ambulating with OT upon PT arrival.  No c/o pain during session.  Currently pt is CGA with transfer from recliner and CGA with ambulation in hallway with RW use.  Pt appearing impulsive and requiring cueing for safety and to avoid obstacles during ambulation.  Pt would currently benefit from skilled PT to address noted impairments and functional limitations (see below for any additional details).  Upon hospital discharge, pt would benefit from ongoing therapy.  Recommend 24/7 assist for safety.     If plan is discharge home, recommend the following: A little help with walking and/or transfers;A little help with bathing/dressing/bathroom;Assistance with cooking/housework;Direct supervision/assist for medications management;Assist for transportation;Help with stairs or ramp for entrance   Can travel by private vehicle        Equipment Recommendations Rolling walker (2 wheels)  Recommendations for Other Services       Functional Status Assessment Patient has had a recent decline in their functional status and demonstrates the ability to make significant improvements in function in a reasonable and predictable amount of time.     Precautions / Restrictions Precautions Precautions: Fall Recall of Precautions/Restrictions:  Impaired Restrictions Weight Bearing Restrictions Per Provider Order: No      Mobility  Bed Mobility               General bed mobility comments: Deferred (pt in recliner beginning/end of session)    Transfers Overall transfer level: Needs assistance Equipment used: Rolling walker (2 wheels) Transfers: Sit to/from Stand Sit to Stand: Contact guard assist           General transfer comment: CGA to stand from recliner    Ambulation/Gait Ambulation/Gait assistance: Contact guard assist Gait Distance (Feet): 240 Feet Assistive device: Rolling walker (2 wheels) Gait Pattern/deviations: Step-through pattern       General Gait Details: pt appearing impulsive and requiring cueing for safety and avoiding obstacles during ambulation  Stairs            Wheelchair Mobility     Tilt Bed    Modified Rankin (Stroke Patients Only)       Balance Overall balance assessment: Needs assistance Sitting-balance support: No upper extremity supported, Feet supported Sitting balance-Leahy Scale: Good Sitting balance - Comments: steady reaching within BOS   Standing balance support: Reliant on assistive device for balance, Bilateral upper extremity supported, During functional activity Standing balance-Leahy Scale: Fair Standing balance comment: CGA for safety ambulating with RW                             Pertinent Vitals/Pain Pain Assessment Pain Assessment: Faces Faces Pain Scale: No hurt Pain Intervention(s): Limited activity within patient's tolerance, Monitored during session HR in 50's bpm and SpO2 sats 93% or greater on room air.    Home Living Family/patient expects to be discharged to:: Private residence  Living Arrangements: Children (son, daughter in law, and 36 y.o. granddaughter) Available Help at Discharge: Available 24 hours/day;Family Type of Home: House Home Access: Stairs to enter Entrance Stairs-Rails: Right Entrance Stairs-Number  of Steps: 2 STE, R side going up   Home Layout: One level Home Equipment: Insurance risk surveyor (2 wheels);Cane - single point;Grab bars - tub/shower;Grab bars - toilet Additional Comments: OT called pt's son to obtain home set-up and PLOF information.    Prior Function Prior Level of Function : Driving;Independent/Modified Independent;History of Falls (last six months)             Mobility Comments: IND household distances, starting with cane 2 months ago; 5-6 falls in last month ADLs Comments: IND prior to last few weeks     Extremity/Trunk Assessment   Upper Extremity Assessment Upper Extremity Assessment: Overall WFL for tasks assessed    Lower Extremity Assessment Lower Extremity Assessment: Generalized weakness       Communication   Communication Communication: No apparent difficulties    Cognition Arousal: Alert Behavior During Therapy: Flat affect, Impulsive   PT - Cognitive impairments: Awareness, Memory, Orientation, Attention, Problem solving, Safety/Judgement   Orientation impairments: Situation                   PT - Cognition Comments: Oriented to person, hospital, month/year; pt did not know why she was in the hospital Following commands: Impaired Following commands impaired: Follows one step commands with increased time, Follows one step commands inconsistently     Cueing Cueing Techniques: Verbal cues, Gestural cues, Visual cues     General Comments General comments (skin integrity, edema, etc.): VSS stable (HR and SpO2 sats on room air).  Nurse updated on pt's status.    Exercises     Assessment/Plan    PT Assessment Patient needs continued PT services  PT Problem List Decreased strength;Decreased activity tolerance;Decreased balance;Decreased mobility;Decreased cognition;Decreased knowledge of use of DME;Decreased knowledge of precautions;Decreased safety awareness       PT Treatment Interventions DME instruction;Gait  training;Stair training;Functional mobility training;Therapeutic activities;Therapeutic exercise;Balance training;Patient/family education    PT Goals (Current goals can be found in the Care Plan section)  Acute Rehab PT Goals Patient Stated Goal: Pt did not state any goal PT Goal Formulation: With patient Time For Goal Achievement: 04/03/24 Potential to Achieve Goals: Fair    Frequency Min 2X/week     Co-evaluation               AM-PAC PT "6 Clicks" Mobility  Outcome Measure Help needed turning from your back to your side while in a flat bed without using bedrails?: None Help needed moving from lying on your back to sitting on the side of a flat bed without using bedrails?: A Little Help needed moving to and from a bed to a chair (including a wheelchair)?: A Little Help needed standing up from a chair using your arms (e.g., wheelchair or bedside chair)?: A Little Help needed to walk in hospital room?: A Little Help needed climbing 3-5 steps with a railing? : A Little 6 Click Score: 19    End of Session Equipment Utilized During Treatment: Gait belt Activity Tolerance: Patient tolerated treatment well Patient left: in chair;with call bell/phone within reach;with chair alarm set;with nursing/sitter in room Nurse Communication: Mobility status;Precautions PT Visit Diagnosis: Other abnormalities of gait and mobility (R26.89);Muscle weakness (generalized) (M62.81);History of falling (Z91.81)    Time: 1350-1402 PT Time Calculation (min) (ACUTE ONLY): 12 min   Charges:  PT Evaluation $PT Eval Low Complexity: 1 Low   PT General Charges $$ ACUTE PT VISIT: 1 Visit        Hendricks Limes, PT 03/20/24, 3:50 PM

## 2024-03-20 NOTE — ED Notes (Addendum)
 Pt woke up requesting her cell phone. Pt does not have any personal belongings with her in the room, aside from clothing. Pt made aware but informed that if she would like, she can use a hospital phone to call out, although, everyone is probably sleeping right now. Pt to try to call later this morning. Pt ABCs intact. RR even and unlabored. Pt in NAD. Bed in lowest locked position. Call bell in reach.

## 2024-03-20 NOTE — ED Notes (Signed)
 Pt back to resting comfortably in ED stretcher. Pt BP maintaining good levels. Pt core temp remains close to WNL. Pt still complaining of being warm. Pt still confused. Pt ABCs intact. RR even and unlabored. Pt in NAD. Bed in lowest locked position. Call bell in reach. Denies needs at this time.

## 2024-03-20 NOTE — ED Notes (Signed)
 Pt resting peacefully in stretcher at this time. Pt ABCs intact. RR even and unlabored. Pt in NAD. Bed in lowest locked position. Call bell in reach.

## 2024-03-21 DIAGNOSIS — N390 Urinary tract infection, site not specified: Secondary | ICD-10-CM | POA: Diagnosis not present

## 2024-03-21 DIAGNOSIS — A419 Sepsis, unspecified organism: Secondary | ICD-10-CM | POA: Diagnosis not present

## 2024-03-21 LAB — CBC
HCT: 30.7 % — ABNORMAL LOW (ref 36.0–46.0)
Hemoglobin: 10.4 g/dL — ABNORMAL LOW (ref 12.0–15.0)
MCH: 32.3 pg (ref 26.0–34.0)
MCHC: 33.9 g/dL (ref 30.0–36.0)
MCV: 95.3 fL (ref 80.0–100.0)
Platelets: 107 10*3/uL — ABNORMAL LOW (ref 150–400)
RBC: 3.22 MIL/uL — ABNORMAL LOW (ref 3.87–5.11)
RDW: 15.6 % — ABNORMAL HIGH (ref 11.5–15.5)
WBC: 4.3 10*3/uL (ref 4.0–10.5)
nRBC: 0 % (ref 0.0–0.2)

## 2024-03-21 LAB — URINE CULTURE: Culture: >=100000 — AB

## 2024-03-21 LAB — BASIC METABOLIC PANEL
Anion gap: 8 (ref 5–15)
BUN: 56 mg/dL — ABNORMAL HIGH (ref 8–23)
CO2: 24 mmol/L (ref 22–32)
Calcium: 8.3 mg/dL — ABNORMAL LOW (ref 8.9–10.3)
Chloride: 111 mmol/L (ref 98–111)
Creatinine, Ser: 1.52 mg/dL — ABNORMAL HIGH (ref 0.44–1.00)
GFR, Estimated: 33 mL/min — ABNORMAL LOW (ref >60)
Glucose, Bld: 93 mg/dL (ref 70–99)
Potassium: 3.5 mmol/L (ref 3.5–5.1)
Sodium: 143 mmol/L (ref 135–145)

## 2024-03-21 LAB — GLUCOSE, CAPILLARY
Glucose-Capillary: 108 mg/dL — ABNORMAL HIGH (ref 70–99)
Glucose-Capillary: 114 mg/dL — ABNORMAL HIGH (ref 70–99)
Glucose-Capillary: 134 mg/dL — ABNORMAL HIGH (ref 70–99)
Glucose-Capillary: 80 mg/dL (ref 70–99)
Glucose-Capillary: 85 mg/dL (ref 70–99)
Glucose-Capillary: 92 mg/dL (ref 70–99)

## 2024-03-21 LAB — T3: T3, Total: 84 ng/dL (ref 71–180)

## 2024-03-21 MED ORDER — CEPHALEXIN 500 MG PO CAPS
500.0000 mg | ORAL_CAPSULE | Freq: Three times a day (TID) | ORAL | Status: DC
  Administered 2024-03-21 – 2024-03-23 (×5): 500 mg via ORAL
  Filled 2024-03-21 (×5): qty 1

## 2024-03-21 MED ORDER — CEPHALEXIN 500 MG PO CAPS: 500.0000 mg | ORAL_CAPSULE | Freq: Three times a day (TID) | ORAL | Status: DC

## 2024-03-21 NOTE — Progress Notes (Addendum)
 PROGRESS NOTE  [name]    DOB: [birth year], 89 y.o.  WUJ:[id number]    Code Status: Limited: Do not attempt resuscitation (DNR) -DNR-LIMITED -Do Not Intubate/DNI    DOA: 03/19/2024   LOS: 2  Brief hospital course   [name] is a 89 y.o. female with PMH recently diagnosed unspecified parkinsonism (see neurology notes 03/06/2024) associated w/ gait instability and ataxia, falling, tremor, cognitive decline. PMH also includes CAD, HTN, HLD, RLS, hypothyroid, GERD. Past several months she and family have noted fairly rapid progression of gait instability and cognitive decline, though symptoms first began approx a year ago. She has moved in w/ her son who is her primary caretaker, but he reports she is usually fairly well able to perform ADL. Past couple weeks worsening, past 2 days more agitated and paranoid saying her son was trying to kill her, not taking medications, uncertain if fall, brought to ED 03/19/24 via EMS.   03/24: In ED, hypothermic, bradycardic, (+)UTI, neg CXR. Started on Cefepime for UTI, also IV fluids, dextrose. Blood cultures collected. Pt somnolent on arrival, not responsive to sternal rub. EDP confirmed w/ son pt is DNR. Rectal temp 91.6 on arrival, up to 93.9 at time of admission and bradycardia resolved. Admitted to hospitalist service.  3/25-3/26: mental status improved, still low temp and HR but trending favorably.   Assessment & Plan  Principal Problem:   Sepsis (HCC)  Severe sepsis due to UTI-  urine culture positive for pansensitive E. coli, blood cultures no growth to date ceftriaxone transition to keflex Midodrine    Hypothermia d/t sepsis - improving  Continues to intermittently need the Bair hugger to maintain normal body temperature Vitals q4h   Prerenal AKI likely d/t sepsis/dehydration - improving Question CKD unknown stage - uncertain baseline recent outpatient Cr 1.52 w/ GFR 33 on 02/21/24 On admission, Cr 1.75 given stability around 1.5  this is likely close to her baseline. Likely d/t sepsis Monitor BMP IV fluids can d/c once taking po    Acute metabolic encephalopathy due to sepsis - improved  Complicated by underlying cognitive impairment due to parkinsonism, potential for progressing dementia  Treat underlying cause(s) Fall and delirium precautions PT/OT    Advanced care planning Confirmed DNR.  Son agrees for treating what we can for now, no intubation/CPR   Hypoglycemia - resolved    Parkinsonism Resume home sinemet    Hypothyroidism No recent TSH on record TSH here low 0.139, T4 elevated TSH here, can be abnormal in acute illness so unless T3/T4 are substantially out of range would not alter treatment Given high T4 will hold Synthroid for now  Pt also taking biotin which can affect thyroid lab assays Recheck TSH/T4/T3 after at least 3 days off biotin    Abnormal AST, mild elevation Question shock liver Trend CMP   Thrombocytopenia- improving. Plt 107. No active bleeding Appears chronic, checked last month was 105 and now is 89 Caution w/ heparin DVT Ppx Monitor CBC   CAD HTN HLD Hold home statin, lisinopril, hydrochlorothiazide for now   RLD resume home requip   Obesity Class 1 based on BMI: Body mass index is 30.14 kg/m.   Body mass index is 30.14 kg/m.  VTE ppx: heparin injection 5,000 Units Start: 03/19/24 2200  Diet:     Diet   Diet regular Room service appropriate? Yes; Fluid consistency: Thin   Consultants: None   Subjective 03/21/24    Pt reports no complaints. Asks for help getting her  coffee. Was able to drink it herself and answer questions appropriately. Denies pain   Objective   Vitals:   03/20/24 1602 03/20/24 1832 03/20/24 2112 03/21/24 0415  BP: (!) 104/53  110/66 (!) 116/50  Pulse: (!) 55  (!) 52 64  Resp: 16  18 16   Temp: (!) 96.3 F (35.7 C)  (!) 97.5 F (36.4 C) 98.1 F (36.7 C)  TempSrc: Oral Bladder    SpO2: 93%  95% 93%  Weight:      Height:         Intake/Output Summary (Last 24 hours) at 03/21/2024 0738 Last data filed at 03/21/2024 0500 Gross per 24 hour  Intake 120 ml  Output 1550 ml  Net -1430 ml   Filed Weights   03/19/24 1215 03/19/24 1218  Weight: 70.5 kg 70 kg     Physical Exam:  General: awake, alert, NAD HEENT: atraumatic, clear conjunctiva, anicteric sclera, MMM, hearing grossly normal Respiratory: normal respiratory effort. Cardiovascular: quick capillary refill, normal S1/S2, RRR, no JVD, murmurs Gastrointestinal: soft, NT, ND Nervous: A&O x3. no gross focal neurologic deficits, normal speech Extremities: moves all equally, no edema, normal tone Skin: dry, intact, normal temperature, normal color. No rashes, lesions or ulcers on exposed skin Psychiatry: flat mood, congruent affect  Labs   I have personally reviewed the following labs and imaging studies CBC    Component Value Date/Time   WBC 4.3 03/21/2024 0527   RBC 3.22 (L) 03/21/2024 0527   HGB 10.4 (L) 03/21/2024 0527   HCT 30.7 (L) 03/21/2024 0527   PLT 107 (L) 03/21/2024 0527   MCV 95.3 03/21/2024 0527   MCH 32.3 03/21/2024 0527   MCHC 33.9 03/21/2024 0527   RDW 15.6 (H) 03/21/2024 0527   LYMPHSABS 0.7 03/19/2024 1309   MONOABS 0.4 03/19/2024 1309   EOSABS 0.1 03/19/2024 1309   BASOSABS 0.0 03/19/2024 1309      Latest Ref Rng & Units 03/21/2024    5:27 AM 03/20/2024    6:55 AM 03/19/2024    1:09 PM  BMP  Glucose 70 - 99 mg/dL 93  65  64   BUN 8 - 23 mg/dL 56  57  69   Creatinine 0.44 - 1.00 mg/dL 4.09  8.11  9.14   Sodium 135 - 145 mmol/L 143  144  144   Potassium 3.5 - 5.1 mmol/L 3.5  3.3  3.7   Chloride 98 - 111 mmol/L 111  109  106   CO2 22 - 32 mmol/L 24  25  27    Calcium 8.9 - 10.3 mg/dL 8.3  8.1  9.2     CT HEAD WO CONTRAST ( ) Result Date: 03/19/2024 CLINICAL DATA:  Agitation EXAM: CT HEAD WITHOUT CONTRAST TECHNIQUE: Contiguous axial images were obtained from the base of the skull through the vertex without intravenous  contrast. RADIATION DOSE REDUCTION: This exam was performed according to the departmental dose-optimization program which includes automated exposure control, adjustment of the mA and/or kV according to patient size and/or use of iterative reconstruction technique. COMPARISON:  MRI 02/21/2024 FINDINGS: Brain: No acute territorial infarction, hemorrhage or intracranial mass. Mild atrophy. Normal ventricle size Vascular: No hyperdense vessel or unexpected calcification. Skull: Normal. Negative for fracture or focal lesion. Sinuses/Orbits: No acute finding. Other: None IMPRESSION: No CT evidence for acute intracranial abnormality. Mild atrophy. Electronically Signed   By: Jasmine Pang M.D.   On: 03/19/2024 18:36   DG Chest Portable 1 View Result Date: 03/19/2024 CLINICAL DATA:  Sepsis  EXAM: PORTABLE CHEST 1 VIEW COMPARISON:  09/04/2007 FINDINGS: Mildly diminished lung volumes. Borderline to mild cardiomegaly. Enlarged mediastinal silhouette likely augmented by patient rotation and portable technique. No acute airspace disease, pleural effusion or pneumothorax IMPRESSION: No active disease. Borderline to mild cardiomegaly. Electronically Signed   By: Jasmine Pang M.D.   On: 03/19/2024 15:37    Disposition Plan & Communication  Patient status: Inpatient  Admitted From: Home Planned disposition location: TBD Anticipated discharge date: TBD pending dispo planning and stability   Family Communication: left son a Warden/ranger: Leeroy Bock, DO [facility] 03/21/2024, 7:38 AM   Available by Epic secure chat 7AM-7PM. If 7PM-7AM, please contact night-coverage.  [facility] contact information found on [email or web address].

## 2024-03-21 NOTE — Plan of Care (Signed)
  Problem: Pain Managment: Goal: General experience of comfort will improve and/or be controlled Outcome: Progressing   Problem: Safety: Goal: Ability to remain free from injury will improve Outcome: Progressing   Problem: Skin Integrity: Goal: Risk for impaired skin integrity will decrease Outcome: Progressing

## 2024-03-21 NOTE — Plan of Care (Signed)

## 2024-03-21 NOTE — Progress Notes (Addendum)
 Physical Therapy Treatment Patient Details Name: [name] MRN: [id number] DOB: August 21, 1935 Today's Date: 03/21/2024   History of Present Illness Pt is an 89 y.o. female presenting to hospital 03/19/24 with c/o agitation; per chart pt has had a decline over past couple months.  PMH includes recently diagnosed unspecified parkinsonism associated with gait instability and ataxia, falling, tremor, and cognitive decline.  Pt admitted with severe sepsis d/t UTI, hypothermia, AKI, acute metabolic encephalopathy d/t sepsis. PMH also includes CAD, htn, HLD, RLS.    PT Comments  Pt received in Semi-Fowler's position and agreeable to therapy.  Pt performed bed mobility with good technique requiring HHA to come upright from the supine position into sitting.  Pt then stood and performed marching with the use of the RW, however noted to be in pain when standing on the R LE.  Pt able to take a few steps forward/backward, but noted increased pain when performing and elected to return to bed to get some rest.   Pt left with all needs met and call bell within reach.  Will continue to monitor the pain felt in the R foot with future sessions.     If plan is discharge home, recommend the following: A little help with walking and/or transfers;A little help with bathing/dressing/bathroom;Assistance with cooking/housework;Direct supervision/assist for medications management;Assist for transportation;Help with stairs or ramp for entrance   Can travel by private vehicle        Equipment Recommendations  Rolling walker (2 wheels)    Recommendations for Other Services       Precautions / Restrictions Precautions Precautions: Fall Recall of Precautions/Restrictions: Impaired Restrictions Weight Bearing Restrictions Per Provider Order: No     Mobility  Bed Mobility Overal bed mobility: Needs Assistance Bed Mobility: Supine to Sit     Supine to sit: Mod assist     General bed mobility comments: Pt  requiring modA from therapist to scoot to the EOB.    Transfers Overall transfer level: Needs assistance Equipment used: Rolling walker (2 wheels) Transfers: Sit to/from Stand Sit to Stand: Contact guard assist           General transfer comment: CGA to stand from bed.    Ambulation/Gait Ambulation/Gait assistance: Contact guard assist Gait Distance (Feet): 5 Feet Assistive device: Rolling walker (2 wheels) Gait Pattern/deviations: Step-through pattern Gait velocity: decreased     General Gait Details: Pt much more confused throughout the session today, or so it seemed and deferred any further ambulation due to R ankle hurting like "pins"   Stairs             Wheelchair Mobility     Tilt Bed    Modified Rankin (Stroke Patients Only)       Balance Overall balance assessment: Needs assistance Sitting-balance support: No upper extremity supported, Feet supported Sitting balance-Leahy Scale: Good Sitting balance - Comments: steady reaching within BOS   Standing balance support: Reliant on assistive device for balance, Bilateral upper extremity supported, During functional activity Standing balance-Leahy Scale: Fair Standing balance comment: CGA for safety ambulating with RW                            Communication Communication Communication: No apparent difficulties  Cognition Arousal: Alert Behavior During Therapy: Flat affect, Impulsive   PT - Cognitive impairments: Awareness, Memory, Orientation, Attention, Problem solving, Safety/Judgement   Orientation impairments: Situation  PT - Cognition Comments: Pt stating she was being discharged today, however no orders have been submitted. Following commands: Impaired Following commands impaired: Follows one step commands with increased time, Follows one step commands inconsistently    Cueing Cueing Techniques: Verbal cues, Gestural cues, Visual cues  Exercises       General Comments        Pertinent Vitals/Pain Pain Assessment Pain Assessment: Faces Faces Pain Scale: Hurts a little bit Pain Location: R ankle Pain Intervention(s): Limited activity within patient's tolerance, Monitored during session, Repositioned    Home Living                          Prior Function            PT Goals (current goals can now be found in the care plan section) Acute Rehab PT Goals Patient Stated Goal: Pt did not state any goal PT Goal Formulation: With patient Time For Goal Achievement: 04/03/24 Potential to Achieve Goals: Fair Progress towards PT goals: Progressing toward goals    Frequency    Min 2X/week      PT Plan      Co-evaluation              AM-PAC PT "6 Clicks" Mobility   Outcome Measure  Help needed turning from your back to your side while in a flat bed without using bedrails?: None Help needed moving from lying on your back to sitting on the side of a flat bed without using bedrails?: A Little Help needed moving to and from a bed to a chair (including a wheelchair)?: A Little Help needed standing up from a chair using your arms (e.g., wheelchair or bedside chair)?: A Little Help needed to walk in hospital room?: A Little Help needed climbing 3-5 steps with a railing? : A Little 6 Click Score: 19    End of Session Equipment Utilized During Treatment: Gait belt Activity Tolerance: Patient tolerated treatment well Patient left: with call bell/phone within reach;with nursing/sitter in room;in bed;with bed alarm set Nurse Communication: Mobility status;Precautions PT Visit Diagnosis: Other abnormalities of gait and mobility (R26.89);Muscle weakness (generalized) (M62.81);History of falling (Z91.81)     Time: 1610-9604 PT Time Calculation (min) (ACUTE ONLY): 15 min  Charges:    $Therapeutic Activity: 8-22 mins PT General Charges $$ ACUTE PT VISIT: 1 Visit                     Nolon Bussing, PT,  DPT Physical Therapist - [place]  [facility]  03/21/24, 2:00 PM

## 2024-03-21 NOTE — Progress Notes (Signed)
 Occupational Therapy Treatment Patient Details Name: [name] MRN: [id number] DOB: 02-Sep-1935 Today's Date: 03/21/2024   History of present illness Pt is an 89 y.o. female presenting to hospital 03/19/24 with c/o agitation; per chart pt has had a decline over past couple months.  PMH includes recently diagnosed unspecified parkinsonism associated with gait instability and ataxia, falling, tremor, and cognitive decline.  Pt admitted with severe sepsis d/t UTI, hypothermia, AKI, acute metabolic encephalopathy d/t sepsis. PMH also includes CAD, htn, HLD, RLS.   OT comments  Pt is supine in bed on arrival. Alert and agreeable to OT session. Reports she had a nerve test to bil feet today and both heels are sore. Pt performed bed mobility with Min A to reach EOB with HOB elevated and cueing for use of bed rails. CGA for STS from EOB to RW with cueing to push from EOB. Ambulated ~60 feet using RW with CGA and no LOB, slower pace for safety. Bear hugger replaced at end of session. Pt left in recliner with chair alarm and call bell in place. She will cont to require skilled acute OT services to maximize her safety and IND to return to PLOF.       If plan is discharge home, recommend the following:  A little help with walking and/or transfers;A lot of help with bathing/dressing/bathroom;Direct supervision/assist for medications management;Supervision due to cognitive status;Direct supervision/assist for financial management;Assistance with cooking/housework;Assist for transportation;Help with stairs or ramp for entrance;A little help with bathing/dressing/bathroom   Equipment Recommendations  Tub/shower seat;BSC/3in1    Recommendations for Other Services      Precautions / Restrictions Precautions Precautions: Fall Recall of Precautions/Restrictions: Impaired Restrictions Weight Bearing Restrictions Per Provider Order: No       Mobility Bed Mobility Overal bed mobility: Needs  Assistance Bed Mobility: Supine to Sit     Supine to sit: Contact guard, HOB elevated, Used rails, Min assist     General bed mobility comments: increased time, cueing and Min A needed to forward scoot    Transfers Overall transfer level: Needs assistance Equipment used: Rolling walker (2 wheels) Transfers: Sit to/from Stand Sit to Stand: Contact guard assist           General transfer comment: CGA from bed and chair without arms to RW; CGA to ambulate ~60 feet using RW     Balance Overall balance assessment: Needs assistance Sitting-balance support: No upper extremity supported, Feet supported Sitting balance-Leahy Scale: Good     Standing balance support: Reliant on assistive device for balance, Bilateral upper extremity supported, During functional activity Standing balance-Leahy Scale: Fair Standing balance comment: CGA for safety ambulating with RW                           ADL either performed or assessed with clinical judgement   ADL Overall ADL's : Needs assistance/impaired                         Toilet Transfer: Contact guard assist;Rolling walker (2 wheels) Toilet Transfer Details (indicate cue type and reason): simulated to recliner                Extremity/Trunk Assessment              Vision       Perception     Praxis     Communication Communication Communication: No apparent difficulties   Cognition Arousal: Alert Behavior  During Therapy: Flat affect, Impulsive                                 Following commands: Impaired Following commands impaired: Follows one step commands with increased time, Follows one step commands inconsistently      Cueing   Cueing Techniques: Verbal cues, Gestural cues, Visual cues  Exercises      Shoulder Instructions       General Comments VSS, replaced bear hugger at end of session    Pertinent Vitals/ Pain       Pain Assessment Pain Assessment:  Faces Faces Pain Scale: Hurts a little bit Pain Location: bil heels Pain Descriptors / Indicators: Sore Pain Intervention(s): Monitored during session, Repositioned, Limited activity within patient's tolerance  Home Living                                          Prior Functioning/Environment              Frequency  Min 2X/week        Progress Toward Goals  OT Goals(current goals can now be found in the care plan section)  Progress towards OT goals: Progressing toward goals  Acute Rehab OT Goals Patient Stated Goal: go home OT Goal Formulation: With patient Time For Goal Achievement: 04/03/24 Potential to Achieve Goals: Good  Plan      Co-evaluation                 AM-PAC OT "6 Clicks" Daily Activity     Outcome Measure   Help from another person eating meals?: None Help from another person taking care of personal grooming?: None Help from another person toileting, which includes using toliet, bedpan, or urinal?: A Little Help from another person bathing (including washing, rinsing, drying)?: A Lot Help from another person to put on and taking off regular upper body clothing?: A Little Help from another person to put on and taking off regular lower body clothing?: A Lot 6 Click Score: 18    End of Session Equipment Utilized During Treatment: Gait belt;Rolling walker (2 wheels)  OT Visit Diagnosis: Other abnormalities of gait and mobility (R26.89);Repeated falls (R29.6);Unsteadiness on feet (R26.81)   Activity Tolerance Patient tolerated treatment well   Patient Left in chair;with chair alarm set;with call bell/phone within reach   Nurse Communication Mobility status        Time: 1530-1559 OT Time Calculation (min): 29 min  Charges: OT General Charges $OT Visit: 1 Visit OT Treatments $Therapeutic Activity: 23-37 mins  Kairee Kozma, OTR/L  03/21/24, 4:13 PM   Jacek Colson E Quadre [place] 03/21/2024, 4:10 PM

## 2024-03-22 DIAGNOSIS — N390 Urinary tract infection, site not specified: Secondary | ICD-10-CM | POA: Diagnosis not present

## 2024-03-22 DIAGNOSIS — A419 Sepsis, unspecified organism: Secondary | ICD-10-CM | POA: Diagnosis not present

## 2024-03-22 LAB — GLUCOSE, CAPILLARY
Glucose-Capillary: 90 mg/dL (ref 70–99)
Glucose-Capillary: 112 mg/dL — ABNORMAL HIGH (ref 70–99)
Glucose-Capillary: 110 mg/dL — ABNORMAL HIGH (ref 70–99)

## 2024-03-22 MED ORDER — ADULT MULTIVITAMIN W/MINERALS CH
1.0000 | ORAL_TABLET | Freq: Every day | ORAL | Status: DC
  Administered 2024-03-23: 1 via ORAL
  Filled 2024-03-22: qty 1

## 2024-03-22 NOTE — TOC Initial Note (Addendum)
 Transition of Care [facility]) - Initial/Assessment Note    Patient Details  Name: [name] MRN: [id number] Date of Birth: January 05, 1935  Transition [facility]) CM/SW Contact:    Chapman Fitch, RN Phone Number: 03/22/2024, 3:30 PM  Clinical Narrative:                       Admitted for: sepsis  Admitted from home with son and daughter in law PCP: [facility] Current home health/prior home health/DME: son confirms they have a RW  Son requested that I check with [facility] in [place] to see if they have a private pay bed available.  Spoke with French Ana at [facility].  They do not have a bed available.  Notified son [name].  He states they are planning on taking her home tomorrow and would like [facility].  Message left for [facility] requesting return call to determine if they can accept  Update: [facility] able to accept with start of care Monday/Tuesday.  Son [name] notified    Patient Goals and CMS Choice            Expected Discharge Plan and Services                                              Prior Living Arrangements/Services                       Activities of Daily Living   ADL Screening (condition at time of admission) Independently performs ADLs?: No Does the patient have a NEW difficulty with bathing/dressing/toileting/self-feeding that is expected to last >3 days?: Yes (Initiates electronic notice to provider for possible OT consult) Does the patient have a NEW difficulty with getting in/out of bed, walking, or climbing stairs that is expected to last >3 days?: Yes (Initiates electronic notice to provider for possible PT consult) Does the patient have a NEW difficulty with communication that is expected to last >3 days?: No Is the patient deaf or have difficulty hearing?: No Does the patient have difficulty seeing, even when wearing glasses/contacts?: No Does the patient have difficulty concentrating, remembering, or making decisions?: Yes  Permission  Sought/Granted                  Emotional Assessment              Admission diagnosis:  Hypothermia, initial encounter [T68.XXXA] Sepsis (HCC) [A41.9] Altered mental status, unspecified altered mental status type [R41.82] Patient Active Problem List   Diagnosis Date Noted   Sepsis (HCC) 03/19/2024   OA (osteoarthritis) of knee 07/12/2016   PCP:  Adrian Prince, MD Pharmacy:   [facility] [phone] - [address] [place] - [address] AT [facility] [address] [place] [address] Phone: [phone] Fax: [phone]     Social Drivers of Health (SDOH) Social History: SDOH Screenings   Food Insecurity: No Food Insecurity (03/20/2024)  Housing: Low Risk  (03/20/2024)  Transportation Needs: No Transportation Needs (03/20/2024)  Utilities: Not At Risk (03/20/2024)  Social Connections: Socially Isolated (03/20/2024)  Tobacco Use: Low Risk  (03/20/2024)   SDOH Interventions:     Readmission Risk Interventions     No data to display

## 2024-03-22 NOTE — Progress Notes (Signed)
 PROGRESS NOTE  [name]    DOB: 18-Feb-1935, 89 y.o.  WUJ:[id number]    Code Status: Limited: Do not attempt resuscitation (DNR) -DNR-LIMITED -Do Not Intubate/DNI    DOA: 03/19/2024   LOS: 3  Brief hospital course   [name] is a 89 y.o. female with PMH recently diagnosed unspecified parkinsonism (see neurology notes 03/06/2024) associated w/ gait instability and ataxia, falling, tremor, cognitive decline. PMH also includes CAD, HTN, HLD, RLS, hypothyroid, GERD. Past several months she and family have noted fairly rapid progression of gait instability and cognitive decline, though symptoms first began approx a year ago. She has moved in w/ her son who is her primary caretaker, but he reports she is usually fairly well able to perform ADL. Past couple weeks worsening, past 2 days more agitated and paranoid saying her son was trying to kill her, not taking medications, uncertain if fall, brought to ED 03/19/24 via EMS.   03/24: In ED, hypothermic, bradycardic, (+)UTI, neg CXR. Started on Cefepime for UTI, also IV fluids, dextrose. Blood cultures collected. Pt somnolent on arrival, not responsive to sternal rub. EDP confirmed w/ son pt is DNR. Rectal temp 91.6 on arrival, up to 93.9 at time of admission and bradycardia resolved. Admitted to hospitalist service.  3/25-3/27: mental status improved. Hemodynamically stable. Urine culture returned with pan-sensitive e coli and has been transitioned to PO Abx. Can be discharged tomorrow when dispo has been arranged with family at home tomorrow.   Assessment & Plan  Principal Problem:   Sepsis (HCC)  Severe sepsis due to UTI-  urine culture positive for pansensitive E. coli, blood cultures no growth to date ceftriaxone transition to keflex Continue Midodrine  PT/OT recommending HH   Hypothermia d/t sepsis - has had normal temp since yesterday afternoon. Was put on bair hugger for comfort again today but no hypothermia.  monitor    Prerenal AKI likely d/t sepsis/dehydration - improving Question CKD unknown stage - uncertain baseline recent outpatient Cr 1.52 w/ GFR 33 on 02/21/24 On admission, Cr 1.75 given stability around 1.5 this is likely close to her baseline. Likely d/t sepsis Monitor BMP   Acute metabolic encephalopathy due to sepsis - improved  Complicated by underlying cognitive impairment due to parkinsonism, potential for progressing dementia  Treat underlying cause(s) Fall and delirium precautions PT/OT  F/u outpatient with neurology   Advanced care planning Confirmed DNR.  Son agrees for treating what we can for now, no intubation/CPR   Hypoglycemia - resolved    Parkinsonism Resume home sinemet    Hypothyroidism No recent TSH on record TSH here low 0.139, T4 elevated TSH here, can be abnormal in acute illness so unless T3/T4 are substantially out of range would not alter treatment Given high T4 will hold Synthroid for now  Pt also taking biotin which can affect thyroid lab assays Recheck TSH/T4/T3 after at least 3 days off biotin    Abnormal AST, mild elevation Question shock liver Trend CMP   Thrombocytopenia- improving. Plt 107. No active bleeding Appears chronic, checked last month was 105 and now is 89 Caution w/ heparin DVT Ppx Monitor CBC   CAD HTN HLD Hold home statin, lisinopril, hydrochlorothiazide for now   RLD resume home requip   Obesity Class 1 based on BMI: Body mass index is 30.14 kg/m.   Body mass index is 30.14 kg/m.  VTE ppx: heparin injection 5,000 Units Start: 03/19/24 2200  Diet:     Diet   Diet regular  Room service appropriate? Yes; Fluid consistency: Thin   Consultants: None   Subjective 03/22/24    Pt reports no complaints. She is looking forward to going home.    Objective   Vitals:   03/21/24 1800 03/21/24 1900 03/22/24 0306 03/22/24 0624  BP:    (!) 144/75  Pulse:    63  Resp:    18  Temp: 98.1 F (36.7 C) 97.7 F (36.5 C)  97.6 F (36.4 C)   TempSrc:   Oral   SpO2:    98%  Weight:      Height:        Intake/Output Summary (Last 24 hours) at 03/22/2024 0735 Last data filed at 03/22/2024 0556 Gross per 24 hour  Intake 557.92 ml  Output 650 ml  Net -92.08 ml   Filed Weights   03/19/24 1215 03/19/24 1218  Weight: 70.5 kg 70 kg     Physical Exam:  General: awake, alert, NAD HEENT: atraumatic, clear conjunctiva, anicteric sclera, MMM, hearing grossly normal Respiratory: normal respiratory effort. Cardiovascular: quick capillary refill, normal S1/S2, RRR, no JVD, murmurs Gastrointestinal: soft, NT, ND Nervous: A&O x3. no gross focal neurologic deficits, normal speech Extremities: moves all equally, no edema, normal tone Skin: dry, intact, normal temperature, normal color. No rashes, lesions or ulcers on exposed skin Psychiatry: flat mood, congruent affect  Labs   I have personally reviewed the following labs and imaging studies CBC    Component Value Date/Time   WBC 4.3 03/21/2024 0527   RBC 3.22 (L) 03/21/2024 0527   HGB 10.4 (L) 03/21/2024 0527   HCT 30.7 (L) 03/21/2024 0527   PLT 107 (L) 03/21/2024 0527   MCV 95.3 03/21/2024 0527   MCH 32.3 03/21/2024 0527   MCHC 33.9 03/21/2024 0527   RDW 15.6 (H) 03/21/2024 0527   LYMPHSABS 0.7 03/19/2024 1309   MONOABS 0.4 03/19/2024 1309   EOSABS 0.1 03/19/2024 1309   BASOSABS 0.0 03/19/2024 1309      Latest Ref Rng & Units 03/21/2024    5:27 AM 03/20/2024    6:55 AM 03/19/2024    1:09 PM  BMP  Glucose 70 - 99 mg/dL 93  65  64   BUN 8 - 23 mg/dL 56  57  69   Creatinine 0.44 - 1.00 mg/dL 1.61  0.96  0.45   Sodium 135 - 145 mmol/L 143  144  144   Potassium 3.5 - 5.1 mmol/L 3.5  3.3  3.7   Chloride 98 - 111 mmol/L 111  109  106   CO2 22 - 32 mmol/L 24  25  27    Calcium 8.9 - 10.3 mg/dL 8.3  8.1  9.2     No results found.   Disposition Plan & Communication  Patient status: Inpatient  Admitted From: Home Planned disposition location: home  with family Anticipated discharge date: 3/28  Family Communication: daughter in law on phone   Author: Leeroy Bock, DO [facility] 03/22/2024, 7:35 AM   Available by Epic secure chat 7AM-7PM. If 7PM-7AM, please contact night-coverage.  [facility] contact information found on [email or web address].

## 2024-03-22 NOTE — TOC PASRR Note (Signed)
 RE: [name]  Date of Birth: July 11, 2035   Date: 03/22/24    To Whom It May Concern:   Please be advised that the above-named patient has a primary diagnosis of dementia which supersedes any psychiatric diagnosis.

## 2024-03-22 NOTE — TOC PASRR Note (Signed)
 RE: [name]  Date of Birth: 02-27-2035 Date: 03/22/24      To Whom It May Concern:   Please be advised that the above-named patient will require a short-term nursing home stay - anticipated 30 days or less for rehabilitation and strengthening.  The plan is for return home

## 2024-03-22 NOTE — NC FL2 (Signed)
  [place] MEDICAID FL2 LEVEL OF CARE FORM     IDENTIFICATION  Patient Name: [name] Birthdate: 11/16/1935 Sex: female Admission Date (Current Location): 03/19/2024  [facility] and [place] Number:  Chiropodist and Address:         Provider Number: [id number]  Attending Physician Name and Address:  Leeroy Bock, MD  Relative Name and Phone Number:       Current Level of Care: Hospital Recommended Level of Care: Skilled Nursing Facility Prior Approval Number:    Date Approved/Denied:   PASRR Number: pending  Discharge Plan: SNF    Current Diagnoses: Patient Active Problem List   Diagnosis Date Noted   Sepsis (HCC) 03/19/2024   OA (osteoarthritis) of knee 07/12/2016    Orientation RESPIRATION BLADDER Height & Weight        Normal Continent Weight: 70 kg Height:  5' (152.4 cm)  BEHAVIORAL SYMPTOMS/MOOD NEUROLOGICAL BOWEL NUTRITION STATUS      Continent Diet (regular)  AMBULATORY STATUS COMMUNICATION OF NEEDS Skin   Limited Assist Verbally Normal                       Personal Care Assistance Level of Assistance              Functional Limitations Info             SPECIAL CARE FACTORS FREQUENCY                       Contractures Contractures Info: Not present    Additional Factors Info  Code Status, Allergies Code Status Info: DNR Allergies Info: NKDA           Current Medications (03/22/2024):  This is the current hospital active medication list Current Facility-Administered Medications  Medication Dose Route Frequency Provider Last Rate Last Admin   carbidopa-levodopa (SINEMET IR) 25-100 MG per tablet immediate release 1 tablet  1 tablet Oral TID AC Sunnie Nielsen, DO   1 tablet at 03/22/24 1212   cephALEXin (KEFLEX) capsule 500 mg  500 mg Oral Q8H Jamelle Rushing L, MD   500 mg at 03/22/24 1349   dextrose 50 % solution 50 mL  1 ampule Intravenous PRN Sunnie Nielsen, DO   50 mL at 03/20/24 0719    feeding supplement (ENSURE ENLIVE / ENSURE PLUS) liquid 237 mL  237 mL Oral BID BM Sunnie Nielsen, DO   237 mL at 03/22/24 1429   heparin injection 5,000 Units  5,000 Units Subcutaneous Q8H Sunnie Nielsen, DO   5,000 Units at 03/22/24 1349   midodrine (PROAMATINE) tablet 10 mg  10 mg Oral TID WC Mansy, Jan A, MD   10 mg at 03/22/24 1212   [START ON 03/23/2024] multivitamin with minerals tablet 1 tablet  1 tablet Oral Daily Leeroy Bock, MD       Oral care mouth rinse  15 mL Mouth Rinse PRN Sunnie Nielsen, DO       rOPINIRole (REQUIP) tablet 1 mg  1 mg Oral TID PRN Sunnie Nielsen, DO         Discharge Medications: Please see discharge summary for a list of discharge medications.  Relevant Imaging Results:  Relevant Lab Results:   Additional Information ss [phone]  Chapman Fitch, RN

## 2024-03-22 NOTE — Progress Notes (Signed)
 Mobility Specialist - Progress Note   03/22/24 1117  Mobility  Activity Ambulated with assistance in hallway  Level of Assistance Contact guard assist, steadying assist  Assistive Device Front wheel walker  Distance Ambulated (ft) 160 ft  Activity Response Tolerated well  Mobility visit 1 Mobility     Pt sitting in recliner upon arrival, utilizing RA. Pt agreeable to activity. Flat effect, but more verbally engaging today. Pt ambulated in hallway with CGA. Voiced mild fatigue and RLE pain during activity. Pt left in chair with alarm set, needs in reach. RN notified.   Filiberto Pinks Mobility Specialist 03/22/24, 11:24 AM

## 2024-03-22 NOTE — Progress Notes (Signed)
 Initial Nutrition Assessment  DOCUMENTATION CODES:   Not applicable  INTERVENTION:   Ensure Enlive po BID, each supplement provides 350 kcal and 20 grams of protein.  MVI po daily   Pt at high refeed risk; recommend monitor potassium, magnesium and phosphorus labs daily until stable  Daily weights   NUTRITION DIAGNOSIS:   Inadequate oral intake related to acute illness as evidenced by per patient/family report.  GOAL:   Patient will meet greater than or equal to 90% of their needs  MONITOR:   PO intake, Supplement acceptance, Labs, Weight trends, Skin, I & O's  REASON FOR ASSESSMENT:   Malnutrition Screening Tool    ASSESSMENT:   89 y/o female with h/o OA, HTN, HLD, RLS, CAD, thyroid disease, GERD and recently diagnosed unspecified parkinsonism (associated with gait instability, ataxia, falling, tremor & cognitive decline) who is admitted with UTI, AKI and sepsis.  Met with pt in room today. Pt reports decreased appetite and oral intake for about 6 months pta. Pt reports a slow decline in her oral intake over time. Pt reports that her appetite and oral intake has been fair in hospital but reports that sometimes her food is cold before she gets it. Pt documented to be eating 25-100% of meals in hospital. Pt had an Ensure on her side table that was 75% drank. RD discussed with pt the importance of adequate nutrition needed to preserve lean muscle. Pt is agreeable to continue Ensure in hospital. Pt is likely at refeed risk. Per chart, pt appears weight stable at baseline. Pt denies any recent weight loss.   Medications reviewed and include: keflex, heparin, midodrine  Labs reviewed: K 3.5 wnl, BUN 56(H), creat 1.52(H) Hgb 10.4(L), Hct 30.7(L) Cbgs- 90, 114, 134, 92, 80, 85, 108 x 24 hrs   NUTRITION - FOCUSED PHYSICAL EXAM:  Flowsheet Row Most Recent Value  Orbital Region No depletion  Upper Arm Region No depletion  Thoracic and Lumbar Region No depletion  Buccal Region  No depletion  Temple Region No depletion  Clavicle Bone Region Mild depletion  Clavicle and Acromion Bone Region Mild depletion  Scapular Bone Region No depletion  Dorsal Hand No depletion  Patellar Region No depletion  Anterior Thigh Region No depletion  Posterior Calf Region No depletion  Edema (RD Assessment) None  Hair Reviewed  Eyes Reviewed  Mouth Reviewed  Skin Reviewed  Nails Reviewed   Diet Order:   Diet Order             Diet regular Room service appropriate? Yes; Fluid consistency: Thin  Diet effective now                  EDUCATION NEEDS:   Education needs have been addressed  Skin:  Skin Assessment: Reviewed RN Assessment (ecchymosis)  Last BM:  3/26- type 6  Height:   Ht Readings from Last 1 Encounters:  03/19/24 5' (1.524 m)    Weight:   Wt Readings from Last 1 Encounters:  03/19/24 70 kg    Ideal Body Weight:  45.45 kg  BMI:  Body mass index is 30.14 kg/m.  Estimated Nutritional Needs:   Kcal:  1400-1600kcal/day  Protein:  70-80g/day  Fluid:  1.3-1.5L/day  Betsey Holiday MS, RD, LDN If unable to be reached, please send secure chat to "RD inpatient" available from 8:00a-4:00p daily

## 2024-03-22 NOTE — Plan of Care (Signed)

## 2024-03-22 NOTE — Progress Notes (Signed)
 PT Cancellation Note  Patient Details Name: [name] MRN: [id number] DOB: 1935/07/07   Cancelled Treatment:    Reason Eval/Treat Not Completed: Other (comment).  Pt in bed with bair hugger in place upon PT arrival; nurse requesting bair hugger to stay in place (d/t pt temperature concerns).  Will re-attempt PT session at a later date/time.  Hendricks Limes, PT 03/22/24, 4:29 PM

## 2024-03-23 DIAGNOSIS — R4182 Altered mental status, unspecified: Secondary | ICD-10-CM | POA: Diagnosis not present

## 2024-03-23 DIAGNOSIS — A419 Sepsis, unspecified organism: Secondary | ICD-10-CM

## 2024-03-23 DIAGNOSIS — N390 Urinary tract infection, site not specified: Secondary | ICD-10-CM | POA: Diagnosis not present

## 2024-03-23 DIAGNOSIS — T68XXXA Hypothermia, initial encounter: Secondary | ICD-10-CM

## 2024-03-23 LAB — GLUCOSE, CAPILLARY: Glucose-Capillary: 93 mg/dL (ref 70–99)

## 2024-03-23 MED ORDER — CEPHALEXIN 500 MG PO CAPS: 500.0000 mg | ORAL_CAPSULE | Freq: Two times a day (BID) | ORAL | 0 refills | Status: AC

## 2024-03-23 NOTE — Discharge Summary (Signed)
 Physician Discharge Summary  Patient: [name] ZOX:[id number] DOB: 1935/10/31   Code Status: Limited: Do not attempt resuscitation (DNR) -DNR-LIMITED -Do Not Intubate/DNI  Admit date: 03/19/2024 Discharge date: 03/23/2024 Disposition: Home health, PT and OT PCP: Adrian Prince, MD  Recommendations for Outpatient Follow-up:  Follow up with PCP within 1-2 weeks Regarding general hospital follow up and preventative care Recommend restarting blood pressure meds if necessary as they were held while inpatient and was briefly on midodrine. Held at [employer].  BMP Follow up with neurology  Discharge Diagnoses:  Principal Problem:   Sepsis (HCC) Active Problems:   Hypothermia   Altered mental status   Sepsis secondary to UTI [facility])  Brief Hospital Course Summary: [name] is a 89 y.o. female with PMH recently diagnosed unspecified parkinsonism (see neurology notes 03/06/2024) associated w/ gait instability and ataxia, falling, tremor, cognitive decline. PMH also includes CAD, HTN, HLD, RLS, hypothyroid, GERD. Past several months she and family have noted fairly rapid progression of gait instability and cognitive decline.   03/24: In ED, hypothermic, bradycardic, (+)UTI, neg CXR. Started on Cefepime for UTI, also IV fluids, dextrose. Blood cultures collected. Pt somnolent on arrival, not responsive to sternal rub.Rectal temp 91.6 on arrival, up to 93.9 at time of admission and bradycardia resolved. Admitted to hospitalist service.  3/25-3/27: mental status improved. Hemodynamically stable. Urine culture returned with pan-sensitive e coli and has been transitioned to PO Abx. Her vital signs stabilized.  Blood pressure medications discontinued due to borderline low blood pressures. Also discontinued home diazepam to reduce sedating medications.  She was evaluated by PT/OT and recommended HH. She was discharged with family in good health.    All other chronic conditions were treated with  home medications.    Discharge Condition: Good, improved Recommended discharge diet: Regular healthy diet  Consultations: None   Procedures/Studies: None   Allergies as of 03/23/2024   No Known Allergies      Medication List     STOP taking these medications    diazepam 5 MG tablet Commonly known as: VALIUM   hydrochlorothiazide 25 MG tablet Commonly known as: HYDRODIURIL   lisinopril 20 MG tablet Commonly known as: ZESTRIL       TAKE these medications    Biotin 81191 MCG Tabs Take by mouth daily.   calcitRIOL 0.25 MCG capsule Commonly known as: ROCALTROL Take 0.25 mcg by mouth daily.   Calcium 200 MG Tabs Take by mouth daily.   carbidopa-levodopa 25-100 MG tablet Commonly known as: SINEMET IR Take 1 tablet by mouth 3 (three) times daily before meals.   cephALEXin 500 MG capsule Commonly known as: KEFLEX Take 1 capsule (500 mg total) by mouth 2 (two) times daily for 4 days.   levothyroxine 88 MCG tablet Commonly known as: SYNTHROID Take 88 mcg by mouth every morning.   pravastatin 40 MG tablet Commonly known as: PRAVACHOL Take 40 mg by mouth every morning.   rOPINIRole 1 MG tablet Commonly known as: REQUIP Take 1 mg by mouth 3 (three) times daily as needed (resltess elgs).        Follow-up Information     Adrian Prince, MD. Schedule an appointment as soon as possible for a visit in 1 week(s).   Specialty: Endocrinology Contact information: [address] [place] [address] [phone]         neurology. Schedule an appointment as soon as possible for a visit in 1 month(s).  Subjective   Pt reports feeling well. Denies dysuria. No confusion. She is looking forward to going home to new house with her family. Granddaughter at bedside.   All questions and concerns were addressed at time of discharge.  Objective  Blood pressure (!) 142/64, pulse 64, temperature 97.7 F (36.5 C), resp. rate 18, height 5'  (1.524 m), weight 72 kg, last menstrual period 12/27/2000, SpO2 95%.   General: Pt is alert, awake, not in acute distress Cardiovascular: RRR, S1/S2 +, no rubs, no gallops Respiratory: CTA bilaterally, no wheezing, no rhonchi Abdominal: Soft, NT, ND, bowel sounds + Extremities: no edema, no cyanosis  The results of significant diagnostics from this hospitalization (including imaging, microbiology, ancillary and laboratory) are listed below for reference.   Imaging studies: CT HEAD WO CONTRAST ( ) Result Date: 03/19/2024 CLINICAL DATA:  Agitation EXAM: CT HEAD WITHOUT CONTRAST TECHNIQUE: Contiguous axial images were obtained from the base of the skull through the vertex without intravenous contrast. RADIATION DOSE REDUCTION: This exam was performed according to the departmental dose-optimization program which includes automated exposure control, adjustment of the mA and/or kV according to patient size and/or use of iterative reconstruction technique. COMPARISON:  MRI 02/21/2024 FINDINGS: Brain: No acute territorial infarction, hemorrhage or intracranial mass. Mild atrophy. Normal ventricle size Vascular: No hyperdense vessel or unexpected calcification. Skull: Normal. Negative for fracture or focal lesion. Sinuses/Orbits: No acute finding. Other: None IMPRESSION: No CT evidence for acute intracranial abnormality. Mild atrophy. Electronically Signed   By: Jasmine Pang M.D.   On: 03/19/2024 18:36   DG Chest Portable 1 View Result Date: 03/19/2024 CLINICAL DATA:  Sepsis EXAM: PORTABLE CHEST 1 VIEW COMPARISON:  09/04/2007 FINDINGS: Mildly diminished lung volumes. Borderline to mild cardiomegaly. Enlarged mediastinal silhouette likely augmented by patient rotation and portable technique. No acute airspace disease, pleural effusion or pneumothorax IMPRESSION: No active disease. Borderline to mild cardiomegaly. Electronically Signed   By: Jasmine Pang M.D.   On: 03/19/2024 15:37    Labs: Basic  Metabolic Panel: Recent Labs  Lab 03/19/24 1309 03/20/24 0655 03/21/24 0527  NA 144 144 143  K 3.7 3.3* 3.5  CL 106 109 111  CO2 27 25 24   GLUCOSE 64* 65* 93  BUN 69* 57* 56*  CREATININE 1.75* 1.51* 1.52*  CALCIUM 9.2 8.1* 8.3*   CBC: Recent Labs  Lab 03/19/24 1309 03/20/24 0655 03/21/24 0527  WBC 6.3 5.3 4.3  NEUTROABS 5.2  --   --   HGB 13.3 10.4* 10.4*  HCT 39.9 30.9* 30.7*  MCV 94.3 95.4 95.3  PLT 89* 87* 107*   Microbiology: Results for orders placed or performed during the hospital encounter of 03/19/24  Urine Culture (for pregnant, neutropenic or urologic patients or patients with an indwelling urinary catheter)     Status: Abnormal   Collection Time: 03/19/24  1:09 PM   Specimen: Urine, Clean Catch  Result Value Ref Range Status   Specimen Description   Final    URINE, CLEAN CATCH Performed at [facility], [address], [place], [place] [address]    Special Requests   Final    NONE Performed at [facility], [address] [place], [place] [address]    Culture >=100,000 COLONIES/mL ESCHERICHIA COLI (A)  Final   Report Status 03/21/2024 FINAL  Final   Organism ID, Bacteria ESCHERICHIA COLI (A)  Final      Susceptibility   Escherichia coli - MIC*    AMPICILLIN <=2 SENSITIVE Sensitive  CEFAZOLIN <=4 SENSITIVE Sensitive     CEFEPIME <=0.12 SENSITIVE Sensitive     CEFTRIAXONE <=0.25 SENSITIVE Sensitive     CIPROFLOXACIN <=0.25 SENSITIVE Sensitive     GENTAMICIN <=1 SENSITIVE Sensitive     IMIPENEM <=0.25 SENSITIVE Sensitive     NITROFURANTOIN <=16 SENSITIVE Sensitive     TRIMETH/SULFA <=20 SENSITIVE Sensitive     AMPICILLIN/SULBACTAM <=2 SENSITIVE Sensitive     PIP/TAZO <=4 SENSITIVE Sensitive ug/mL    * >=100,000 COLONIES/mL ESCHERICHIA COLI  Blood culture (routine x 2)     Status: None (Preliminary result)   Collection Time: 03/19/24  1:24 PM   Specimen: BLOOD  Result Value Ref Range Status   Specimen Description BLOOD  BLOOD RIGHT FOREARM  Final   Special Requests   Final    BOTTLES DRAWN AEROBIC AND ANAEROBIC Blood Culture adequate volume   Culture   Final    NO GROWTH 4 DAYS Performed at [facility], [address] [place], [place] [address]    Report Status PENDING  Incomplete  Blood culture (routine x 2)     Status: None (Preliminary result)   Collection Time: 03/19/24  1:25 PM   Specimen: BLOOD  Result Value Ref Range Status   Specimen Description BLOOD LEFT ANTECUBITAL  Final   Special Requests   Final    BOTTLES DRAWN AEROBIC AND ANAEROBIC Blood Culture adequate volume   Culture   Final    NO GROWTH 4 DAYS Performed at [facility], [address], [place], [place] [address]    Report Status PENDING  Incomplete    Time coordinating discharge: Over 30 minutes  Leeroy Bock, MD  [facility] 03/23/2024, 10:53 AM

## 2024-03-23 NOTE — Discharge Instructions (Addendum)
 Please continue your antibiotic until it is complete.   I have held your blood pressure medications since you had lower blood pressure readings in the hospital while sick. Please continue to hold these until you have followed up with your PCP in 1-2 weeks. They may decide to restart the blood pressure medications at that time if your blood pressure has recovered fully.    the [facility] [employer] [facility] offers a [employer] that anyone can call toll free at [phone]. The friendship line is available 24 hours a day  [employer] is a Program of All-inclusive Care for the Elderly (PACE). Their mission is to promote and sustain the independence of seniors wishing to remain in the community. They provide seniors with comprehensive long-term health, social, medical and dietary care. Their program is a safe alternative to nursing home care. [phone]  [facility] Physical Address [facility] [address] [place], [place] [address] Phone: [phone]. . Online zoom yoga class, connect with others without leaving your home [facility] offers [place] dance cardio sessions for individuals via Zoom. This program provides: - Dance fitness activities Please contact program for more information. Servinganyone in need adults 18+ hiv/aids individuals families Call [phone]  Email [email or web address]@yahoo .com to get more info  [employer] offers an online [employer] to individuals where they can receive help to focus on their best health. Whether you're a [employer] member or not, the neighborhood center offers a... Main Serviceshealth education  exercise & fitness  community support services  recreation  virtual support Other Servicessupport groups Servinganyone in need adults young adults teens seniors individuals families [email or web address] to get more info  Schedule on their website  The [facility] offers an  array of activities for adults age 44 and over. This program provides:- Fitness and health programs- Tech classes- Activity books Main Serviceshealth education  community support services  exercise & fitness  recreation  more education Servingseniors  Call [phone]    For more resources go online to [employer] and type in you zipcode

## 2024-03-23 NOTE — Progress Notes (Signed)
 Discharge instructions reviewed with patient and [name] (granddaughter) Patient discharged to home. Per granddaughter they spoke with someone from [facility] yesterday foe home health needs. No questions at this time

## 2024-03-23 NOTE — TOC Transition Note (Signed)
 Transition of Care [facility]) - Discharge Note   Patient Details  Name: [name] MRN: [id number] Date of Birth: 06/06/1935  [facility]) CM/SW Contact:  Margarito Liner, LCSW Phone Number: 03/23/2024, 11:09 AM   Clinical Narrative:  Patient has orders to discharge home today. CSW left message for [facility] liaison to notify. Son declined 3-in-1. No further concerns. CSW signing off.   Final next level of care: Home w Home Health Services Barriers to Discharge: Barriers Resolved   Patient Goals and CMS Choice     Choice offered to / list presented to : Adult Children      Discharge Placement                Patient to be transferred to facility by: Son Name of family member notified: [name] Patient and family notified of of transfer: 03/23/24  Discharge Plan and Services Additional resources added to the After Visit Summary for                            [facility] Arranged: PT, OT, Speech Therapy, Nurse's Aide [facility] Agency: [facility] Date [facility] Agency Contacted: 03/23/24   Representative spoke with at [facility] Agency: Kandee Keen  Social Drivers of Health (SDOH) Interventions SDOH Screenings   Food Insecurity: No Food Insecurity (03/20/2024)  Housing: Low Risk  (03/20/2024)  Transportation Needs: No Transportation Needs (03/20/2024)  Utilities: Not At Risk (03/20/2024)  Social Connections: Socially Isolated (03/20/2024)  Tobacco Use: Low Risk  (03/20/2024)     Readmission Risk Interventions     No data to display

## 2024-03-23 NOTE — Care Management Important Message (Signed)
 Important Message  Patient Details  Name: [name] MRN: [id number] Date of Birth: 08/02/1935   Important Message Given:  Yes - Medicare IM     Bernadette Hoit 03/23/2024, 10:35 AM

## 2024-03-24 LAB — CULTURE, BLOOD (ROUTINE X 2)
Culture: NO GROWTH
Culture: NO GROWTH
Special Requests: ADEQUATE
Special Requests: ADEQUATE

## 2024-03-29 ENCOUNTER — Encounter: Payer: Self-pay | Admitting: Diagnostic Neuroimaging

## 2024-04-16 ENCOUNTER — Telehealth: Payer: Self-pay | Admitting: Diagnostic Neuroimaging

## 2024-04-16 NOTE — Telephone Encounter (Signed)
 Appointment details confirmed

## 2024-05-11 ENCOUNTER — Emergency Department

## 2024-05-11 ENCOUNTER — Other Ambulatory Visit: Payer: Self-pay

## 2024-05-11 ENCOUNTER — Emergency Department
Admission: EM | Admit: 2024-05-11 | Discharge: 2024-05-12 | Disposition: A | Discharge status: Home / Self Care | Attending: Emergency Medicine | Admitting: Emergency Medicine

## 2024-05-11 DIAGNOSIS — Z96643 Presence of artificial hip joint, bilateral: Secondary | ICD-10-CM | POA: Diagnosis not present

## 2024-05-11 DIAGNOSIS — S79912A Unspecified injury of left hip, initial encounter: Secondary | ICD-10-CM | POA: Diagnosis present

## 2024-05-11 DIAGNOSIS — X58XXXA Exposure to other specified factors, initial encounter: Secondary | ICD-10-CM | POA: Insufficient documentation

## 2024-05-11 DIAGNOSIS — S73005A Unspecified dislocation of left hip, initial encounter: Secondary | ICD-10-CM

## 2024-05-11 DIAGNOSIS — Y828 Other medical devices associated with adverse incidents: Secondary | ICD-10-CM | POA: Diagnosis not present

## 2024-05-11 DIAGNOSIS — T84021A Dislocation of internal left hip prosthesis, initial encounter: Secondary | ICD-10-CM | POA: Insufficient documentation

## 2024-05-11 MED ORDER — FENTANYL CITRATE PF 50 MCG/ML IJ SOSY
50.0000 ug | PREFILLED_SYRINGE | Freq: Once | INTRAMUSCULAR | Status: AC
  Administered 2024-05-11: 50 ug via INTRAVENOUS
  Filled 2024-05-11: qty 1

## 2024-05-11 MED ORDER — FENTANYL CITRATE PF 50 MCG/ML IJ SOSY: 50.0000 ug | PREFILLED_SYRINGE | Freq: Once | INTRAMUSCULAR | Status: DC | PRN

## 2024-05-11 NOTE — ED Triage Notes (Signed)
 Pt presenting via EMS from home. Pt reports she was bending over when L hip popped out of place. Hx of same x3 in the past. Pt denies falling. Denies injury in any other area.   Pt received 175mcg fentanyl  en route, still screaming and crying in pain on arrival to room. GCS 15

## 2024-05-12 ENCOUNTER — Telehealth: Payer: Self-pay | Admitting: Emergency Medicine

## 2024-05-12 ENCOUNTER — Emergency Department

## 2024-05-12 MED ORDER — PROPOFOL 10 MG/ML IV BOLUS
INTRAVENOUS | Status: AC | PRN
  Administered 2024-05-12 (×2): 20 mg via INTRAVENOUS
  Administered 2024-05-12: 40 mg via INTRAVENOUS

## 2024-05-12 MED ORDER — PROPOFOL 10 MG/ML IV BOLUS
0.5000 mg/kg | Freq: Once | INTRAVENOUS | Status: DC
  Filled 2024-05-12: qty 20

## 2024-05-12 MED ORDER — ONDANSETRON HCL 4 MG/2ML IJ SOLN
4.0000 mg | Freq: Once | INTRAMUSCULAR | Status: AC
Start: 2024-05-12 — End: 2024-05-12
  Administered 2024-05-12: 4 mg via INTRAVENOUS
  Filled 2024-05-12: qty 2

## 2024-05-12 MED ORDER — ROPINIROLE HCL 1 MG PO TABS
1.0000 mg | ORAL_TABLET | Freq: Once | ORAL | Status: AC
  Administered 2024-05-12: 1 mg via ORAL
  Filled 2024-05-12: qty 1

## 2024-05-12 NOTE — ED Notes (Signed)
 Pt ambulatory to toilet in room and back to bed without assistance

## 2024-05-12 NOTE — ED Provider Notes (Addendum)
 Reduction of dislocation Date/Time: 2AM Performed by: Ruth Cove Authorized by: Ruth Cove Consent: Verbal consent obtained. Risks and benefits: risks, benefits and alternatives were discussed Consent given by: patient Required items: required blood products, implants, devices, and special equipment available Time out: Immediately prior to procedure a "time out" was called to verify the correct patient, procedure, equipment, support staff and site/side marked as required.  Patient sedated: Yes, propofol   Vitals: Vital signs were monitored during sedation. Patient tolerance: Patient tolerated the procedure well with no immediate complications. Joint: Left hip Reduction technique: Traction and countertraction     Ruth Cove, MD 05/12/24 0454    Ruth Cove, MD 05/12/24 [phone]

## 2024-05-12 NOTE — Discharge Instructions (Signed)
 Keep your knee immobilizer in place until otherwise directed by your orthopedic doctor.  Please inform them that you were seen in the emergency department and arrange follow-up with them.  Return to the ER for new or worsening symptoms.

## 2024-05-12 NOTE — ED Provider Notes (Signed)
 [facility] Provider Note    Event Date/Time   First MD Initiated Contact with Patient 05/11/24 2332     (approximate)   History   Hip Injury   HPI  [name] is a 89 year old female presenting to the emergency department for evaluation of hip injury.  Patient has a history of bilateral hip replacements.  She bent over shortly prior to presentation and felt her hip pop out of place.  No fall or head trauma.  Has happened multiple times in the past, most recently a few years ago.  Follows with [facility].  Received 175 mcg of fentanyl  with EMS.     Physical Exam   Triage Vital Signs: ED Triage Vitals  Encounter Vitals Group     BP 05/11/24 2347 (!) 227/83     Systolic BP Percentile --      Diastolic BP Percentile --      Pulse Rate 05/11/24 2347 62     Resp 05/11/24 2347 20     Temp 05/12/24 0000 97.9 F (36.6 C)     Temp Source 05/12/24 0000 Oral     SpO2 05/11/24 2344 99 %     Weight 05/11/24 2347 160 lb (72.6 kg)     Height 05/11/24 2347 5\' 2"  (1.575 m)     Head Circumference --      Peak Flow --      Pain Score 05/11/24 2347 10     Pain Loc --      Pain Education --      Exclude from Growth Chart --     Most recent vital signs: Vitals:   05/12/24 0200 05/12/24 0230  BP: (!) 166/97 (!) 163/58  Pulse: 78 72  Resp: (!) 21 14  Temp:    SpO2: 99% 98%     General: Awake, interactive  CV:  Regular rate, good peripheral perfusion.  Resp:  Unlabored respirations, lung is clear to auscultation Abd:  Nondistended, soft, nontender Neuro:  Symmetric facial movement, fluid speech MSK:  There is obvious deformity of the left lower extremity with internal rotation.  There are intact distal pulses.  There is no tenderness over the distal lower extremity.   ED Results / Procedures / Treatments   Labs (all labs ordered are listed, but only abnormal results are displayed) Labs Reviewed - No data to display   EKG EKG independently  reviewed interpreted by myself (ER attending) demonstrates:    RADIOLOGY Imaging independently reviewed and interpreted by myself demonstrates:  X-Harel Repetto pelvis demonstrates left hip dislocation Postreduction x-Drystan Reader demonstrates interval reduction  Formal Radiology Read:  DG Pelvis 1-2 Views Result Date: 05/12/2024 CLINICAL DATA:  Postreduction left hip EXAM: PELVIS - 1-2 VIEW COMPARISON:  05/12/2024 FINDINGS: Interval reduction of the previously seen dislocated left hip replacement. Normal AP alignment. No fracture. SI joints symmetric. IMPRESSION: Interval reduction of the previously seen dislocated left hip replacement. Electronically Signed   By: Janeece Mechanic M.D.   On: 05/12/2024 01:23   DG Pelvis 1-2 Views Result Date: 05/12/2024 CLINICAL DATA:  Left hip pain EXAM: PELVIS - 1-2 VIEW COMPARISON:  None Available. FINDINGS: Bilateral total hip arthroplasty has been performed. There is superior dislocation of the left femoral arthroplasty component from the acetabular cup. No superimposed fracture identified. Right hip arthroplasty components overlie the expected position. Soft tissues are unremarkable. IMPRESSION: 1. Left hip arthroplasty dislocation. Electronically Signed   By: Worthy Heads M.D.   On: 05/12/2024 00:18  PROCEDURES:  Critical Care performed: No  .Sedation  Date/Time: 05/12/2024 3:06 AM  Performed by: Claria Crofts, MD Authorized by: Claria Crofts, MD   Consent:    Consent obtained:  Written   Consent given by:  Patient Universal protocol:    Immediately prior to procedure, a time out was called: yes   Pre-sedation assessment:    Time since last food or drink:  >2 hours   ASA classification: class 2 - patient with mild systemic disease     Mallampati score:  II - soft palate, uvula, fauces visible   Neck mobility: normal     Pre-sedation assessments completed and reviewed: airway patency, cardiovascular function, hydration status, mental status, nausea/vomiting, pain  level and respiratory function   A pre-sedation assessment was completed prior to the start of the procedure Immediate pre-procedure details:    Reassessment: Patient reassessed immediately prior to procedure     Reviewed: vital signs     Verified: bag valve mask available   Procedure details (see MAR for exact dosages):    Preoxygenation:  Nasal cannula   Sedation:  Propofol    Intended level of sedation: deep   Analgesia:  Fentanyl    Intra-procedure monitoring:  Blood pressure monitoring, continuous capnometry, frequent LOC assessments, cardiac monitor, continuous pulse oximetry and frequent vital sign checks   Intra-procedure events: none     Intra-procedure management:  Airway repositioning   Total Provider sedation time (minutes):  32 Post-procedure details:   A post-sedation assessment was completed following the completion of the procedure.   Attendance: Constant attendance by certified staff until patient recovered     Recovery: Patient returned to pre-procedure baseline     Patient is stable for discharge or admission: yes     Procedure completion:  Tolerated well, no immediate complications .Reduction of dislocation  Date/Time: 05/12/2024 3:09 AM  Performed by: Ruth Cove, MD Authorized by: Ruth Cove, MD  Consent: Written consent obtained. Risks and benefits: risks, benefits and alternatives were discussed Consent given by: patient Patient understanding: patient states understanding of the procedure being performed Patient consent: the patient's understanding of the procedure matches consent given Procedure consent: procedure consent matches procedure scheduled Time out: Immediately prior to procedure a "time out" was called to verify the correct patient, procedure, equipment, support staff and site/side marked as required. Local anesthesia used: no  Anesthesia: Local anesthesia used: no  Sedation: Patient sedated: yes  Comments: L hip  dislocaiton      MEDICATIONS ORDERED IN ED: Medications  fentaNYL  (SUBLIMAZE ) injection 50 mcg (has no administration in time range)  propofol  (DIPRIVAN ) 10 mg/mL bolus/IV push 36.3 mg (0 mg Intravenous Hold 05/12/24 0123)  fentaNYL  (SUBLIMAZE ) injection 50 mcg (50 mcg Intravenous Given 05/11/24 2354)  ondansetron  (ZOFRAN ) injection 4 mg (4 mg Intravenous Given 05/12/24 0035)  propofol  (DIPRIVAN ) 10 mg/mL bolus/IV push (20 mg Intravenous Given 05/12/24 0100)  rOPINIRole  (REQUIP ) tablet 1 mg (1 mg Oral Given 05/12/24 0150)     IMPRESSION / MDM / ASSESSMENT AND PLAN / ED COURSE  I reviewed the triage vital signs and the nursing notes.  Differential diagnosis includes, but is not limited to, hip dislocation, fracture, soft tissue injury  Patient's presentation is most consistent with acute presentation with potential threat to life or bodily function.  89 year old female presenting with obvious hip deformity.  Neurovascularly intact.  X-Eliany Mccarter confirms dislocation.  Patient was sedated and reduction was performed as above.  Postreduction x-Furman Trentman with interval realignment.  Patient did return to her  neurologic baseline.  She was able to get up and ambulate.  She is comfortable discharge home.  She will contact her orthopedic doctor to inform them of her dislocation.  Strict return precautions provided.  Patient discharged in stable condition.      FINAL CLINICAL IMPRESSION(S) / ED DIAGNOSES   Final diagnoses:  Dislocation of left hip, initial encounter [facility])     Rx / DC Orders   ED Discharge Orders     None        Note:  This document was prepared using Dragon voice recognition software and may include unintentional dictation errors.   Claria Crofts, MD 05/12/24 [phone]

## 2024-05-12 NOTE — Telephone Encounter (Signed)
 This RN discharge paperwork including DNR paperwork on the back of the wheelchair in the lobby. Attempted to call pt and pt's emergency contact, Jenny Mohs, with no success.

## 2024-05-18 ENCOUNTER — Ambulatory Visit: Payer: Medicare Other | Admitting: Diagnostic Neuroimaging

## 2024-05-29 ENCOUNTER — Encounter: Payer: Self-pay | Admitting: Diagnostic Neuroimaging

## 2024-05-29 ENCOUNTER — Ambulatory Visit: Admitting: Diagnostic Neuroimaging

## 2024-05-29 VITALS — BP 132/78 | HR 64 | Ht 62.0 in | Wt 155.0 lb

## 2024-05-29 DIAGNOSIS — R269 Unspecified abnormalities of gait and mobility: Secondary | ICD-10-CM

## 2024-05-29 NOTE — Progress Notes (Signed)
 [facility]  PATIENT: [name] DOB: 1935-02-03  REFERRING CLINICIAN: Rosslyn Coons, MD HISTORY FROM: patient  REASON FOR VISIT: follow up   HISTORICAL  CHIEF COMPLAINT:  Chief Complaint  Patient presents with   Follow-up    RM 7, Pt alone. Here for f/u d/t gait abnormality, and Parkinsonism. Pt states is really having trouble sleeping. The diazepam was D/C'd and she hasn't been able to rest. Also reports numbness and tingling BLE and sometimes radiates into buttocks. Pt states she doesn't feel the carbidopa /levodopa  is working.    HISTORY OF PRESENT ILLNESS:   UPDATE (05/29/24, VRP): Since last visit, went to hospital for sepsis UTI in late March 2025. Then tx'd and doing better. Tolerating carb/levo. No more tremor. Now using cane.   PRIOR HPI (03/06/24, VRP): 89 year old female here for evaluation of gait difficulty, speech difficulty, memory loss.  Gait difficulty and falls started about 1 year ago.  This is progressively worsened over time.  Patient has a hard time articulating the onset and progression of her symptoms.  She was living alone, but checked on on a daily basis by her son.  Few months ago she moved in with her son due to worsening symptoms.  Starting 2 to 3 months ago symptoms significantly worsened with more gait and balance difficulty, cognitive decline, slow speech, quiet voice, masked facies and other symptoms.   REVIEW OF SYSTEMS: Full 14 system review of systems performed and negative with exception of: As per HPI.  ALLERGIES: Allergies  Allergen Reactions   Cipro [Ciprofloxacin Hcl]     Severe muscle pain    HOME MEDICATIONS: Outpatient Medications Prior to Visit  Medication Sig Dispense Refill   Biotin 29518 MCG TABS Take by mouth daily.     calcitRIOL (ROCALTROL) 0.25 MCG capsule Take 0.25 mcg by mouth daily.     Calcium 200 MG TABS Take by mouth daily.     hydrochlorothiazide  (HYDRODIURIL ) 12.5 MG tablet Take 12.5 mg by  mouth daily as needed.     levothyroxine  (SYNTHROID , LEVOTHROID) 88 MCG tablet Take 88 mcg by mouth every morning.      pravastatin  (PRAVACHOL ) 40 MG tablet Take 40 mg by mouth every morning.      rOPINIRole  (REQUIP ) 1 MG tablet Take 1 mg by mouth 3 (three) times daily as needed (resltess elgs).      vitamin E 180 MG (400 UNITS) capsule Take 400 Units by mouth daily.     carbidopa -levodopa  (SINEMET  IR) 25-100 MG tablet Take 1 tablet by mouth 3 (three) times daily before meals. (Patient not taking: Reported on 05/29/2024) 90 tablet 6   No facility-administered medications prior to visit.    PAST MEDICAL HISTORY: Past Medical History:  Diagnosis Date   Arthritis    Blood in stool    saw GI doctor for evaluation   CAD (coronary artery disease)    Cataract    GERD (gastroesophageal reflux disease)    History of colon polyps    adenomatous   Hyperlipidemia    Hypertension    Hypothyroidism    Osteopenia    PSVT (paroxysmal supraventricular tachycardia) (HCC)    RLS (restless legs syndrome)    Thyroid disease    Hypothyroidism   Vitamin D deficiency     PAST SURGICAL HISTORY: Past Surgical History:  Procedure Laterality Date   CARDIAC CATHETERIZATION     cataract surg Left 2/13   Dr. McCuen   DILATION AND CURETTAGE OF UTERUS  x2   heart catherization     07/08   HIP SURGERY  5/12, 1/13   hip out of joint, replaced in OR   JOINT REPLACEMENT     KNEE ARTHROSCOPY Right    LUMBAR LAMINECTOMY  02/17/11   PERIPHERAL IRIDOTOMY     bil eyes   THYROIDECTOMY, PARTIAL  1969   TOTAL HIP ARTHROPLASTY Bilateral 1/04, 4/06   TOTAL KNEE ARTHROPLASTY Right 07/12/2016   Procedure: RIGHT TOTAL KNEE ARTHROPLASTY;  Surgeon: Liliane Rei, MD;  Location: [facility];  Service: Orthopedics;  Laterality: Right;   TUBAL LIGATION      FAMILY HISTORY: Family History  Problem Relation Age of Onset   Heart disease Father    Hypertension Father    Stroke Father    Lung cancer Sister     Hypertension Sister    Thyroid disease Mother    Heart disease Brother    Diabetes Paternal Aunt        x 3   Diabetes Paternal Grandmother     SOCIAL HISTORY: Social History   Socioeconomic History   Marital status: Widowed    Spouse name: Not on file   Number of children: 1   Years of education: Not on file   Highest education level: Not on file  Occupational History   Occupation: retired  Tobacco Use   Smoking status: Never   Smokeless tobacco: Never  Vaping Use   Vaping status: Never Used  Substance and Sexual Activity   Alcohol use: No   Drug use: No   Sexual activity: Never  Other Topics Concern   Not on file  Social History Narrative   Not on file   Social Drivers of Health   Financial Resource Strain: Not on file  Food Insecurity: No Food Insecurity (03/20/2024)   Hunger Vital Sign    Worried About Running Out of Food in the Last Year: Never true    Ran Out of Food in the Last Year: Never true  Transportation Needs: No Transportation Needs (03/20/2024)   PRAPARE - Administrator, Civil Service (Medical): No    Lack of Transportation (Non-Medical): No  Physical Activity: Not on file  Stress: Not on file  Social Connections: Socially Isolated (03/20/2024)   Social Connection and Isolation Panel [NHANES]    Frequency of Communication with Friends and Family: Twice a week    Frequency of Social Gatherings with Friends and Family: Three times a week    Attends Religious Services: Never    Active Member of Clubs or Organizations: No    Attends Banker Meetings: Never    Marital Status: Widowed  Intimate Partner Violence: Not At Risk (03/20/2024)   Humiliation, Afraid, Rape, and Kick questionnaire    Fear of Current or Ex-Partner: No    Emotionally Abused: No    Physically Abused: No    Sexually Abused: No     PHYSICAL EXAM  GENERAL EXAM/CONSTITUTIONAL: Vitals:  Vitals:   05/29/24 1606  BP: 132/78  Pulse: 64  Weight: 155 lb  (70.3 kg)  Height: 5\' 2"  (1.575 m)   Body mass index is 28.35 kg/m. Wt Readings from Last 3 Encounters:  05/29/24 155 lb (70.3 kg)  05/11/24 160 lb (72.6 kg)  03/23/24 158 lb 11.7 oz (72 kg)   Patient is in no distress; well developed, nourished and groomed; neck is supple  CARDIOVASCULAR: Examination of carotid arteries is normal; no carotid bruits Regular rate and rhythm, no murmurs  Examination of peripheral vascular system by observation and palpation is normal  EYES: Ophthalmoscopic exam of optic discs and posterior segments is normal; no papilledema or hemorrhages No results found.  MUSCULOSKELETAL: Gait, strength, tone, movements noted in Neurologic exam below  NEUROLOGIC: MENTAL STATUS:      No data to display         awake, alert, oriented to person, place, time Language fluent, comprehension intact  CRANIAL NERVE:  2nd, 3rd, 4th, 6th - pupils equal and reactive to light, visual fields full to confrontation, extraocular muscles intact, no nystagmus 5th - facial sensation symmetric 7th - facial strength symmetric 8th - hearing intact 9th - palate elevates symmetrically, uvula midline 11th - shoulder shrug symmetric 12th - tongue protrusion midline MILD HOARSE VOICE  MOTOR:  BUE 5; BLE 5 MILD COGWHEELING RIGIDITY IN BUE NO TREMOR  MILD BRADYKINESIA IN BUE AND BLE  SENSORY:  normal and symmetric to light touch, temperature, vibration  COORDINATION:  finger-nose-finger, fine finger movements SLOW  REFLEXES:  deep tendon reflexes 2+ IN BUE; 1+ IN BLE NEG HOFFMANS POSITIVE SNOUT AND GLABELLAR REFLEXES  GAIT/STATION:  SLOW TO STAND; USING CANE    DIAGNOSTIC DATA (LABS, IMAGING, TESTING) - I reviewed patient records, labs, notes, testing and imaging myself where available.  Lab Results  Component Value Date   WBC 4.3 03/21/2024   HGB 10.4 (L) 03/21/2024   HCT 30.7 (L) 03/21/2024   MCV 95.3 03/21/2024   PLT 107 (L) 03/21/2024       Component Value Date/Time   NA 143 03/21/2024 0527   K 3.5 03/21/2024 0527   CL 111 03/21/2024 0527   CO2 24 03/21/2024 0527   GLUCOSE 93 03/21/2024 0527   BUN 56 (H) 03/21/2024 0527   CREATININE 1.52 (H) 03/21/2024 0527   CALCIUM 8.3 (L) 03/21/2024 0527   PROT 6.2 (L) 03/19/2024 1309   ALBUMIN 3.4 (L) 03/19/2024 1309   AST 72 (H) 03/19/2024 1309   ALT 12 03/19/2024 1309   ALKPHOS 120 03/19/2024 1309   BILITOT 1.8 (H) 03/19/2024 1309   GFRNONAA 33 (L) 03/21/2024 0527   GFRAA 49 (L) 07/14/2016 0443   No results found for: "CHOL", "HDL", "LDLCALC", "LDLDIRECT", "TRIG", "CHOLHDL" No results found for: "HGBA1C" Lab Results  Component Value Date   VITAMINB12 802 03/06/2024   Lab Results  Component Value Date   TSH 0.139 (L) 03/19/2024       ASSESSMENT AND PLAN  89 y.o. year old female here with gait difficulty, cognitive decline, bradykinesia, intermittent tremor, postural instability, dysarthria, starting in early 2024, worsening in 2025.  Signs symptoms most consistent with neurodegenerative disorder such as Parkinson's disease versus other Parkinson plus syndrome.   Dx:  1. Gait abnormality      PLAN:  GAIT DIFFICULTY, FALLS, COGNITIVE DECLINE MASKED FACIES, RESTING TREMOR, BRADYKINESIA, POSTURAL INSTABILITY / ATAXIA, DYSARTHRIA (since early 2024; worsening over time)  - suspect idiopathic parkinson's disease  - patient seems to have done better on carb / levo 1 tab three times a day (her symptoms are much improved compared to last visit; however patient wants to stop it, so she can reduce by 1 tab per day, per week, to wean off)  - continue fall precautions   Return for return to PCP, pending if symptoms worsen or fail to improve.   Omega Bible, MD 05/29/2024, 4:50 PM Certified in Neurology, Neurophysiology and Neuroimaging  [facility] [address] [place], [place] [address] [phone]

## 2024-05-29 NOTE — Patient Instructions (Addendum)
-   patient seems to have done better on carb / levo 1 tab three times a day (her symptoms are much improved compared to last visit; however patient wants to stop it, so she can reduce by 1 tab per day, per week, to wean off)  - continue fall precautions

## 2024-07-10 ENCOUNTER — Telehealth: Admitting: Diagnostic Neuroimaging

## 2024-10-16 ENCOUNTER — Ambulatory Visit: Admitting: Diagnostic Neuroimaging

## 2024-11-14 ENCOUNTER — Telehealth: Payer: Self-pay | Admitting: Podiatry

## 2024-11-14 NOTE — Telephone Encounter (Signed)
 I am trying to get into MyChart but apparently my saved information is not going through. I've requested a code twice, have not received it either time and um my email [email or web address]. I have to leave in a few minutes but you can leave me a voicemail or send me a text message or you can send it to my e-mail. Thank you. Bye.

## 2024-11-14 NOTE — Telephone Encounter (Signed)
 Called patient and left a voicemail for the number to the [facility] MyChart Help Desk of [phone]. Told patient to call us  if she has any questions regarding her appointment with us  tomorrow at [phone].

## 2024-11-15 ENCOUNTER — Other Ambulatory Visit: Payer: Self-pay | Admitting: Podiatry

## 2024-11-15 ENCOUNTER — Ambulatory Visit: Admitting: Podiatry

## 2024-11-15 ENCOUNTER — Ambulatory Visit (INDEPENDENT_AMBULATORY_CARE_PROVIDER_SITE_OTHER)

## 2024-11-15 DIAGNOSIS — M21619 Bunion of unspecified foot: Secondary | ICD-10-CM

## 2024-11-15 DIAGNOSIS — M21612 Bunion of left foot: Secondary | ICD-10-CM

## 2024-11-15 NOTE — Progress Notes (Signed)
 Subjective:  Patient ID: [name], female    DOB: [birth year],  MRN: [id number]  Chief Complaint  Patient presents with   Toe Pain    89 y.o. female presents with the above complaint.  Patient presents with continuous pain to the left big toe.  She states she has been dealing with this for quite some time.  She is because of the arthritis in the big toe joint.  She cannot bend her toe anymore she still causing her a lot of discomfort.  She has not seen MRIs prior to seeing me for this.  She has tried some shoe gear modification padding protecting offloading none of which has helped.  She wishes to undergo surgical intervention at this time.  I also discussed with right ankle pain.  She states that has been bothering her as well.  She would like to discuss steroid injection for the right side   Review of Systems: Negative except as noted in the HPI. Denies N/V/F/Ch.  Past Medical History:  Diagnosis Date   Arthritis    Blood in stool    saw GI doctor for evaluation   CAD (coronary artery disease)    Cataract    GERD (gastroesophageal reflux disease)    History of colon polyps    adenomatous   Hyperlipidemia    Hypertension    Hypothyroidism    Osteopenia    PSVT (paroxysmal supraventricular tachycardia)    RLS (restless legs syndrome)    Thyroid disease    Hypothyroidism   Vitamin D deficiency     Current Outpatient Medications:    Biotin 89999 MCG TABS, Take by mouth daily., Disp: , Rfl:    calcitRIOL (ROCALTROL) 0.25 MCG capsule, Take 0.25 mcg by mouth daily., Disp: , Rfl:    Calcium 200 MG TABS, Take by mouth daily., Disp: , Rfl:    hydrochlorothiazide  (HYDRODIURIL ) 12.5 MG tablet, Take 12.5 mg by mouth daily as needed., Disp: , Rfl:    levothyroxine  (SYNTHROID , LEVOTHROID) 88 MCG tablet, Take 88 mcg by mouth every morning. , Disp: , Rfl:    pravastatin  (PRAVACHOL ) 40 MG tablet, Take 40 mg by mouth every morning. , Disp: , Rfl:    rOPINIRole  (REQUIP ) 1 MG tablet,  Take 1 mg by mouth 3 (three) times daily as needed (resltess elgs). , Disp: , Rfl:    vitamin E 180 MG (400 UNITS) capsule, Take 400 Units by mouth daily., Disp: , Rfl:   Social History   Tobacco Use  Smoking Status Never  Smokeless Tobacco Never    Allergies  Allergen Reactions   Cipro [Ciprofloxacin Hcl]     Severe muscle pain   Objective:  There were no vitals filed for this visit. There is no height or weight on file to calculate BMI. Constitutional Well developed. Well nourished.  Vascular Dorsalis pedis pulses palpable bilaterally. Posterior tibial pulses palpable bilaterally. Capillary refill normal to all digits.  No cyanosis or clubbing noted. Pedal hair growth normal.  Neurologic Normal speech. Oriented to person, place, and time. Epicritic sensation to light touch grossly present bilaterally.  Dermatologic Nails well groomed and normal in appearance. No open wounds. No skin lesions.  Orthopedic: Pain on palpation left big toe joint.  No pain at the sesamoidal complex pain with range of motion severe crepitus noted limited range of motion noted.  Hallux rigidus noted.  Pain on palpation to the right ankle pain at the medial gutter.  Some pain with range of motion  Radiographs: 5 is a skeletally mature adult left foot s is noted at the big toe joint.  Bunion deformity noted.  Midfoot arthritis noted.  Evere osteoarthritis: Assessment:   1. Bunion    Plan:  Patient was evaluated and treated and all questions answered.  Left severe osteoarthritis with bunion deformity - All questions and concerns were discussed with the patient in extensive detail given the clinical findings of limited range of motion in setting of bunion deformity patient will benefit from surgical intervention I discussed left first metatarsophalangeal joint fusion.  She states understanding would like to proceed with joint fusion - I discussed my preoperative intraoperative postoperative plan  with the patient in extensive detail she states understand like to proceed with surgery -Informed surgical risk consent was reviewed and read aloud to the patient.  I reviewed the films.  I have discussed my findings with the patient in great detail.  I have discussed all risks including but not limited to infection, stiffness, scarring, limp, disability, deformity, damage to blood vessels and nerves, numbness, poor healing, need for braces, arthritis, chronic pain, amputation, death.  All benefits and realistic expectations discussed in great detail.  I have made no promises as to the outcome.  I have provided realistic expectations.  I have offered the patient a 2nd opinion, which they have declined and assured me they preferred to proceed despite the risks   No follow-ups on file.

## 2024-12-10 ENCOUNTER — Telehealth: Payer: Self-pay | Admitting: Podiatry

## 2024-12-10 NOTE — Telephone Encounter (Signed)
 Attempted to call patient to set date for surgery but was unable to get through patient's screening that was set up on phone

## 2024-12-25 ENCOUNTER — Telehealth: Payer: Self-pay | Admitting: Podiatry

## 2024-12-25 NOTE — Telephone Encounter (Signed)
 Called and patient is scheduled for surgery on 01/14/2025  @ [facility]. Patient not on any GLP1 or  blood thinners. Pharmacy correct in chart.

## 2025-01-08 ENCOUNTER — Encounter
Admission: RE | Admit: 2025-01-08 | Discharge: 2025-01-08 | Disposition: A | Discharge status: Home / Self Care | Source: Ambulatory Visit | Attending: Podiatry | Admitting: Podiatry

## 2025-01-08 ENCOUNTER — Telehealth: Payer: Self-pay | Admitting: Podiatry

## 2025-01-08 ENCOUNTER — Other Ambulatory Visit: Payer: Self-pay

## 2025-01-08 VITALS — Ht 63.0 in | Wt 155.0 lb

## 2025-01-08 DIAGNOSIS — I10 Essential (primary) hypertension: Secondary | ICD-10-CM

## 2025-01-08 DIAGNOSIS — A419 Sepsis, unspecified organism: Secondary | ICD-10-CM

## 2025-01-08 DIAGNOSIS — E782 Mixed hyperlipidemia: Secondary | ICD-10-CM

## 2025-01-08 DIAGNOSIS — Z79899 Other long term (current) drug therapy: Secondary | ICD-10-CM

## 2025-01-08 HISTORY — DX: Chronic kidney disease, stage 3 unspecified: N18.30

## 2025-01-08 NOTE — Telephone Encounter (Signed)
 DOS- 01/15/2025  HALLUX MPJ FUSION LT- 71249  [employer] EFFECTIVE DATE- 12/27/2024  DEDUCTIBLE- $0 REMAINING- $0 OOP- $4200 REMAINING- $4200 COINSURANCE- 0%  PER [employer] PORTAL, PRIOR AUTH IS NOT REQUIRED FOR CPT CODE 71249. DECISION ID# [id number]

## 2025-01-08 NOTE — Patient Instructions (Addendum)
 Your procedure is scheduled on: Tuesday 01/15/25 To find out your arrival time, please call [phone] between 1PM - 3PM on: Monday 01/14/25 Report to the Registration Desk on the 1st floor of the [facility]. If your arrival time is 6:00 am, do not arrive before that time as the [facility] entrance doors do not open until 6:00 am.  REMEMBER: Instructions that are not followed completely may result in serious medical risk, up to and including death; or upon the discretion of your surgeon and anesthesiologist your surgery may need to be rescheduled.  Do not eat food after midnight the night before surgery.  No gum chewing or hard candies.  You may however, drink CLEAR liquids up to 2 hours before you are scheduled to arrive for your surgery. Do not drink anything within 2 hours of your scheduled arrival time.  Clear liquids include: - water   - apple juice without pulp - gatorade (not RED colors) - black coffee or tea (Do NOT add milk or creamers to the coffee or tea) Do NOT drink anything that is not on this list.  One week prior to surgery: Stop Anti-inflammatories (NSAIDS) such as Advil, Aleve, Ibuprofen, Motrin, Naproxen, Naprosyn and Aspirin based products such as Excedrin, Goody's Powder, BC Powder.  You may however, continue to take Tylenol  if needed for pain up until the day of surgery.  Stop ANY OVER THE COUNTER supplements and vitamins for at least 7 days until after surgery.  Continue taking all of your other prescription medications up until the day of surgery.  ON THE DAY OF SURGERY ONLY TAKE THESE MEDICATIONS WITH SIPS OF WATER :  levothyroxine  (SYNTHROID ) 112 MCG tablet  pravastatin  (PRAVACHOL ) 40 MG tablet   No Alcohol for 24 hours before or after surgery.  No Smoking including e-cigarettes for 24 hours before surgery.  No chewable tobacco products for at least 6 hours before surgery.  No nicotine patches on the day of surgery.  Do not use any recreational  drugs for at least a week (preferably 2 weeks) before your surgery.  Please be advised that the combination of cocaine and anesthesia may have negative outcomes, up to and including death. If you test positive for cocaine, your surgery will be cancelled.  On the morning of surgery brush your teeth with toothpaste and water , you may rinse your mouth with mouthwash if you wish. Do not swallow any toothpaste or mouthwash.  Use scrub as instructed by Dr Tobie  Do not shave body hair from the neck down 48 hours before surgery.  Do not wear lotions, powders, or perfumes on the day of surgery  Wear comfortable clothing (specific to your surgery type) to the hospital.  Do not wear jewelry, make-up, hairpins, clips or nail polish.  For welded (permanent) jewelry: bracelets, anklets, waist bands, etc.  Please have this removed prior to surgery.  If it is not removed, there is a chance that hospital personnel will need to cut it off on the day of surgery.  Contact lenses, hearing aids and dentures may not be worn into surgery.  Do not bring valuables to the hospital. [facility] is not responsible for any missing/lost belongings or valuables.   Notify your doctor if there is any change in your medical condition (cold, fever, infection).  After surgery, you can help prevent lung complications by doing breathing exercises.  Take deep breaths and cough every 1-2 hours. Your doctor may order a device called an Incentive Spirometer to help you  take deep breaths.  If you are being discharged the day of surgery, you will not be allowed to drive home. You will need a responsible individual to drive you home and stay with you for 24 hours after surgery.   Please call the Pre-admissions Testing Dept. at [phone] if you have any questions about these instructions.  Surgery Visitation Policy:  Patients having surgery or a procedure may have two visitors.  Children under the age of 75 must have an  adult with them who is not the patient.  Merchandiser, Retail to address health-related social needs:  [email or web address]

## 2025-01-09 ENCOUNTER — Encounter
Admission: RE | Admit: 2025-01-09 | Discharge: 2025-01-09 | Disposition: A | Discharge status: Home / Self Care | Source: Ambulatory Visit | Attending: Orthopedic Surgery | Admitting: Orthopedic Surgery

## 2025-01-09 DIAGNOSIS — R6520 Severe sepsis without septic shock: Secondary | ICD-10-CM | POA: Insufficient documentation

## 2025-01-09 DIAGNOSIS — Z01812 Encounter for preprocedural laboratory examination: Secondary | ICD-10-CM | POA: Diagnosis present

## 2025-01-09 DIAGNOSIS — Z01818 Encounter for other preprocedural examination: Secondary | ICD-10-CM | POA: Diagnosis not present

## 2025-01-09 DIAGNOSIS — Z79899 Other long term (current) drug therapy: Secondary | ICD-10-CM | POA: Diagnosis not present

## 2025-01-09 DIAGNOSIS — G9341 Metabolic encephalopathy: Secondary | ICD-10-CM | POA: Insufficient documentation

## 2025-01-09 DIAGNOSIS — Z01810 Encounter for preprocedural cardiovascular examination: Secondary | ICD-10-CM | POA: Diagnosis present

## 2025-01-09 DIAGNOSIS — I10 Essential (primary) hypertension: Secondary | ICD-10-CM | POA: Insufficient documentation

## 2025-01-09 DIAGNOSIS — A419 Sepsis, unspecified organism: Secondary | ICD-10-CM | POA: Insufficient documentation

## 2025-01-09 DIAGNOSIS — E782 Mixed hyperlipidemia: Secondary | ICD-10-CM | POA: Insufficient documentation

## 2025-01-09 LAB — CBC
HCT: 38.7 % (ref 36.0–46.0)
Hemoglobin: 12.8 g/dL (ref 12.0–15.0)
MCH: 31.6 pg (ref 26.0–34.0)
MCHC: 33.1 g/dL (ref 30.0–36.0)
MCV: 95.6 fL (ref 80.0–100.0)
Platelets: 158 K/uL (ref 150–400)
RBC: 4.05 MIL/uL (ref 3.87–5.11)
RDW: 14.9 % (ref 11.5–15.5)
WBC: 6.1 K/uL (ref 4.0–10.5)
nRBC: 0 % (ref 0.0–0.2)

## 2025-01-09 LAB — BASIC METABOLIC PANEL WITH GFR
Anion gap: 12 (ref 5–15)
BUN: 34 mg/dL — ABNORMAL HIGH (ref 8–23)
CO2: 26 mmol/L (ref 22–32)
Calcium: 9.5 mg/dL (ref 8.9–10.3)
Chloride: 105 mmol/L (ref 98–111)
Creatinine, Ser: 1.08 mg/dL — ABNORMAL HIGH (ref 0.44–1.00)
GFR, Estimated: 49 mL/min — ABNORMAL LOW
Glucose, Bld: 114 mg/dL — ABNORMAL HIGH (ref 70–99)
Potassium: 3.5 mmol/L (ref 3.5–5.1)
Sodium: 143 mmol/L (ref 135–145)

## 2025-01-10 ENCOUNTER — Ambulatory Visit: Payer: Self-pay | Admitting: Orthopedic Surgery

## 2025-01-14 ENCOUNTER — Telehealth: Payer: Self-pay | Admitting: Podiatry

## 2025-01-14 NOTE — Telephone Encounter (Signed)
 Patient called states she is having surgery on 01/15/25 and needs to get her pain meds called in today.

## 2025-01-15 ENCOUNTER — Other Ambulatory Visit: Payer: Self-pay | Admitting: Podiatry

## 2025-01-15 ENCOUNTER — Other Ambulatory Visit: Payer: Self-pay

## 2025-01-15 ENCOUNTER — Ambulatory Visit

## 2025-01-15 ENCOUNTER — Encounter
Admission: RE | Disposition: A | Discharge status: Home / Self Care | Payer: Self-pay | Source: Home / Self Care | Attending: Podiatry

## 2025-01-15 ENCOUNTER — Encounter: Payer: Self-pay | Admitting: Podiatry

## 2025-01-15 ENCOUNTER — Encounter: Admitting: Urgent Care

## 2025-01-15 ENCOUNTER — Ambulatory Visit: Admitting: Urgent Care

## 2025-01-15 ENCOUNTER — Ambulatory Visit
Admission: RE | Admit: 2025-01-15 | Discharge: 2025-01-15 | Disposition: A | Discharge status: Home / Self Care | Attending: Podiatry | Admitting: Podiatry

## 2025-01-15 DIAGNOSIS — E039 Hypothyroidism, unspecified: Secondary | ICD-10-CM | POA: Diagnosis not present

## 2025-01-15 DIAGNOSIS — I440 Atrioventricular block, first degree: Secondary | ICD-10-CM | POA: Insufficient documentation

## 2025-01-15 DIAGNOSIS — M19072 Primary osteoarthritis, left ankle and foot: Secondary | ICD-10-CM | POA: Diagnosis not present

## 2025-01-15 DIAGNOSIS — K219 Gastro-esophageal reflux disease without esophagitis: Secondary | ICD-10-CM | POA: Insufficient documentation

## 2025-01-15 DIAGNOSIS — M1A0721 Idiopathic chronic gout, left ankle and foot, with tophus (tophi): Secondary | ICD-10-CM | POA: Diagnosis not present

## 2025-01-15 DIAGNOSIS — N183 Chronic kidney disease, stage 3 unspecified: Secondary | ICD-10-CM | POA: Insufficient documentation

## 2025-01-15 DIAGNOSIS — I129 Hypertensive chronic kidney disease with stage 1 through stage 4 chronic kidney disease, or unspecified chronic kidney disease: Secondary | ICD-10-CM | POA: Diagnosis not present

## 2025-01-15 DIAGNOSIS — M21612 Bunion of left foot: Secondary | ICD-10-CM | POA: Diagnosis present

## 2025-01-15 MED ORDER — DEXAMETHASONE SOD PHOSPHATE PF 10 MG/ML IJ SOLN
INTRAMUSCULAR | Status: DC | PRN
  Administered 2025-01-15: 10 mg via INTRAVENOUS

## 2025-01-15 MED ORDER — HYDRALAZINE HCL 20 MG/ML IJ SOLN
INTRAMUSCULAR | Status: DC | PRN
  Administered 2025-01-15 (×2): 4 mg via INTRAVENOUS

## 2025-01-15 MED ORDER — IBUPROFEN 800 MG PO TABS: 800.0000 mg | ORAL_TABLET | Freq: Four times a day (QID) | ORAL | 1 refills | Status: AC | PRN

## 2025-01-15 MED ORDER — OXYCODONE HCL 5 MG PO TABS
5.0000 mg | ORAL_TABLET | Freq: Once | ORAL | Status: AC | PRN
  Administered 2025-01-15: 5 mg via ORAL

## 2025-01-15 MED ORDER — FENTANYL CITRATE (PF) 100 MCG/2ML IJ SOLN
INTRAMUSCULAR | Status: AC
  Filled 2025-01-15: qty 2

## 2025-01-15 MED ORDER — OXYCODONE HCL 5 MG PO TABS
ORAL_TABLET | ORAL | Status: AC
  Filled 2025-01-15: qty 1

## 2025-01-15 MED ORDER — CHLORHEXIDINE GLUCONATE 0.12 % MT SOLN
OROMUCOSAL | Status: AC
  Filled 2025-01-15: qty 15

## 2025-01-15 MED ORDER — ACETAMINOPHEN 10 MG/ML IV SOLN: 1000.0000 mg | Freq: Once | INTRAVENOUS | Status: DC | PRN

## 2025-01-15 MED ORDER — BUPIVACAINE HCL (PF) 0.5 % IJ SOLN
INTRAMUSCULAR | Status: DC | PRN
  Administered 2025-01-15: 10 mL

## 2025-01-15 MED ORDER — CEFAZOLIN SODIUM-DEXTROSE 2-4 GM/100ML-% IV SOLN
2.0000 g | Freq: Once | INTRAVENOUS | Status: AC
  Administered 2025-01-15: 2 g via INTRAVENOUS

## 2025-01-15 MED ORDER — PROPOFOL 500 MG/50ML IV EMUL
INTRAVENOUS | Status: DC | PRN
  Administered 2025-01-15: 125 ug/kg/min via INTRAVENOUS

## 2025-01-15 MED ORDER — GLYCOPYRROLATE 0.2 MG/ML IJ SOLN
INTRAMUSCULAR | Status: DC | PRN
  Administered 2025-01-15: .2 mg via INTRAVENOUS

## 2025-01-15 MED ORDER — FENTANYL CITRATE (PF) 100 MCG/2ML IJ SOLN
INTRAMUSCULAR | Status: DC | PRN
  Administered 2025-01-15 (×3): 10 ug via INTRAVENOUS

## 2025-01-15 MED ORDER — PROPOFOL 10 MG/ML IV BOLUS
INTRAVENOUS | Status: DC | PRN
  Administered 2025-01-15: 10 mg via INTRAVENOUS
  Administered 2025-01-15 (×2): 20 mg via INTRAVENOUS
  Administered 2025-01-15: 10 mg via INTRAVENOUS

## 2025-01-15 MED ORDER — LACTATED RINGERS IV SOLN: INTRAVENOUS | Status: DC

## 2025-01-15 MED ORDER — ONDANSETRON HCL 4 MG/2ML IJ SOLN: 4.0000 mg | Freq: Once | INTRAMUSCULAR | Status: DC | PRN

## 2025-01-15 MED ORDER — FENTANYL CITRATE (PF) 100 MCG/2ML IJ SOLN
25.0000 ug | INTRAMUSCULAR | Status: DC | PRN
  Administered 2025-01-15: 50 ug via INTRAVENOUS

## 2025-01-15 MED ORDER — BUPIVACAINE HCL (PF) 0.5 % IJ SOLN
INTRAMUSCULAR | Status: AC
  Filled 2025-01-15: qty 30

## 2025-01-15 MED ORDER — CEFAZOLIN SODIUM-DEXTROSE 2-4 GM/100ML-% IV SOLN
INTRAVENOUS | Status: AC
  Filled 2025-01-15: qty 100

## 2025-01-15 MED ORDER — OXYCODONE HCL 5 MG/5ML PO SOLN: 5.0000 mg | Freq: Once | ORAL | Status: AC | PRN

## 2025-01-15 MED ORDER — 0.9 % SODIUM CHLORIDE (POUR BTL) OPTIME
TOPICAL | Status: DC | PRN
  Administered 2025-01-15: 500 mL

## 2025-01-15 MED ORDER — DEXMEDETOMIDINE HCL IN NACL 200 MCG/50ML IV SOLN
INTRAVENOUS | Status: DC | PRN
  Administered 2025-01-15 (×2): 8 ug via INTRAVENOUS
  Administered 2025-01-15: 4 ug via INTRAVENOUS

## 2025-01-15 MED ORDER — OXYCODONE-ACETAMINOPHEN 5-325 MG PO TABS: 1.0000 | ORAL_TABLET | ORAL | 0 refills | Status: AC | PRN

## 2025-01-15 MED ORDER — ACETAMINOPHEN 10 MG/ML IV SOLN
INTRAVENOUS | Status: DC | PRN
  Administered 2025-01-15: 1000 mg via INTRAVENOUS

## 2025-01-15 MED ORDER — ORAL CARE MOUTH RINSE: 15.0000 mL | Freq: Once | OROMUCOSAL | Status: AC

## 2025-01-15 MED ORDER — CHLORHEXIDINE GLUCONATE 0.12 % MT SOLN
15.0000 mL | Freq: Once | OROMUCOSAL | Status: AC
  Administered 2025-01-15: 15 mL via OROMUCOSAL

## 2025-01-15 MED ORDER — ONDANSETRON HCL 4 MG/2ML IJ SOLN
INTRAMUSCULAR | Status: DC | PRN
  Administered 2025-01-15: 4 mg via INTRAVENOUS

## 2025-01-15 NOTE — H&P (Signed)
 [name] is an 89 y.o. female.   Chief Complaint: Left first metatarsophalangeal joint HPI: Left foot first MPJ pain pain with range of motion.  Patient is here to get scheduled as an outpatient left first metatarsophalangeal joint fusion.  Denies any nausea fever chills vomiting.  Past Medical History:  Diagnosis Date   Arthritis    Blood in stool    saw GI doctor for evaluation   CAD (coronary artery disease)    Cataract    CKD (chronic kidney disease) stage 3, GFR 30-59 ml/min (HCC)    GERD (gastroesophageal reflux disease)    History of colon polyps    adenomatous   Hyperlipidemia    Hypertension    Hypothyroidism    Osteopenia    PSVT (paroxysmal supraventricular tachycardia)    RLS (restless legs syndrome)    Thyroid disease    Hypothyroidism   Vitamin D deficiency     Past Surgical History:  Procedure Laterality Date   CARDIAC CATHETERIZATION     cataract surg Left 2/13   Dr. Leslee   DILATION AND CURETTAGE OF UTERUS     x2   heart catherization     07/08   HIP SURGERY  5/12, 1/13   hip out of joint, replaced in OR   JOINT REPLACEMENT     KNEE ARTHROSCOPY Right    LUMBAR LAMINECTOMY  02/17/11   PERIPHERAL IRIDOTOMY     bil eyes   THYROIDECTOMY, PARTIAL  1969   TOTAL HIP ARTHROPLASTY Bilateral 1/04, 4/06   TOTAL KNEE ARTHROPLASTY Right 07/12/2016   Procedure: RIGHT TOTAL KNEE ARTHROPLASTY;  Surgeon: Dempsey Moan, MD;  Location: [facility];  Service: Orthopedics;  Laterality: Right;   TUBAL LIGATION      Family History  Problem Relation Age of Onset   Heart disease Father    Hypertension Father    Stroke Father    Lung cancer Sister    Hypertension Sister    Thyroid disease Mother    Heart disease Brother    Diabetes Paternal Aunt        x 3   Diabetes Paternal Grandmother    Social History:  reports that she has never smoked. She has never used smokeless tobacco. She reports that she does not drink alcohol and does not use drugs.  Allergies:  Allergies[1]  Medications Prior to Admission  Medication Sig Dispense Refill   Biotin 89999 MCG TABS Take 10 mg by mouth daily.     calcitRIOL (ROCALTROL) 0.25 MCG capsule Take 0.25 mcg by mouth daily.     Calcium 200 MG TABS Take 200 mg by mouth daily.     diazepam (VALIUM) 5 MG tablet Take 5 mg by mouth at bedtime.     hydrochlorothiazide  (HYDRODIURIL ) 12.5 MG tablet Take 12.5 mg by mouth daily.     levothyroxine  (SYNTHROID ) 112 MCG tablet Take 112 mcg by mouth daily before breakfast.     Polyethyl Glycol-Propyl Glycol (SYSTANE OP) Place 1 drop into both eyes in the morning.     pravastatin  (PRAVACHOL ) 40 MG tablet Take 40 mg by mouth every morning.      rOPINIRole  (REQUIP ) 1 MG tablet Take 1 mg by mouth 3 (three) times daily.     vitamin E 180 MG (400 UNITS) capsule Take 400 Units by mouth daily.     diclofenac Sodium (VOLTAREN) 1 % GEL Apply 2 g topically daily as needed (pain).     ibuprofen  (ADVIL ) 800 MG tablet Take 1  tablet (800 mg total) by mouth every 6 (six) hours as needed. 60 tablet 1   oxyCODONE -acetaminophen  (PERCOCET) 5-325 MG tablet Take 1 tablet by mouth every 4 (four) hours as needed for severe pain (pain score 7-10). 30 tablet 0    No results found for this or any previous visit (from the past 48 hours). No results found.  Review of Systems  Musculoskeletal:  Positive for joint swelling.  All other systems reviewed and are negative.   Blood pressure (!) 164/75, pulse (!) 57, temperature (!) 97.1 F (36.2 C), temperature source Temporal, resp. rate 15, height 5' 3 (1.6 m), weight 70.3 kg, last menstrual period 12/27/2000, SpO2 95%. Physical Exam Constitutional:      Appearance: Normal appearance.  Cardiovascular:     Rate and Rhythm: Normal rate and regular rhythm.  Pulmonary:     Effort: Pulmonary effort is normal.     Breath sounds: Normal breath sounds.  Abdominal:     General: Abdomen is flat. Bowel sounds are normal.     Palpations: Abdomen is soft.   Musculoskeletal:        General: Swelling and tenderness present.     Left lower leg: Edema present.  Neurological:     Mental Status: She is alert.      Assessment/Plan A 89 y.o. female presents with left first metatarsophalangeal joint severe bunion deformity with underlying arthritis - All questions concerns were discussed with the patient in extensive detail - Clinically patient does not have any respiratory or cardiac complaints.  Patient will benefit from left first metatarsophalangeal joint fusion with fixation I discussed with the patient she states understanding - N.p.o. confirmed after midnight - Weightbearing as tolerated in surgical shoe after the surgery.  Franky SHAUNNA Blanch, DPM 01/15/2025, 11:12 AM       [1]  Allergies Allergen Reactions   Cipro [Ciprofloxacin Hcl]     Severe muscle pain

## 2025-01-15 NOTE — Transfer of Care (Signed)
 Immediate Anesthesia Transfer of Care Note  Patient: [name]  Procedure(s) Performed: FUSION, JOINT, GREAT TOE LEFT (Left: Toe)  Patient Location: PACU  Anesthesia Type:General  Level of Consciousness: awake, drowsy, and patient cooperative  Airway & Oxygen Therapy: Patient Spontanous Breathing and Patient connected to face mask oxygen  Post-op Assessment: Report given to RN and Post -op Vital signs reviewed and stable  Post vital signs: Reviewed and stable  Last Vitals:  Vitals Value Taken Time  BP 100/83 01/15/25 12:50  Temp    Pulse 66 01/15/25 12:58  Resp 27 01/15/25 12:58  SpO2 96 % 01/15/25 12:58  Vitals shown include unfiled device data.  Last Pain:  Vitals:   01/15/25 0940  TempSrc: Temporal  PainSc: 7          Complications: No notable events documented.

## 2025-01-15 NOTE — Anesthesia Postprocedure Evaluation (Signed)
"   Anesthesia Post Note  Patient: [name]  Procedure(s) Performed: FUSION, JOINT, GREAT TOE LEFT (Left: Toe)  Patient location during evaluation: PACU Anesthesia Type: General Level of consciousness: awake and alert, oriented and patient cooperative Pain management: pain level controlled Vital Signs Assessment: post-procedure vital signs reviewed and stable Respiratory status: spontaneous breathing, nonlabored ventilation and respiratory function stable Cardiovascular status: blood pressure returned to baseline and stable Postop Assessment: adequate PO intake Anesthetic complications: no   No notable events documented.   Last Vitals:  Vitals:   01/15/25 1340 01/15/25 1345  BP:  109/67  Pulse: (!) 57 (!) 53  Resp: 15 13  Temp:  (!) 36.1 C  SpO2: 92% 93%    Last Pain:  Vitals:   01/15/25 0940  TempSrc: Temporal  PainSc: 7                  Alfonso Ruths      "

## 2025-01-15 NOTE — Discharge Instructions (Signed)
 After Surgery Instructions   1) If you are recuperating from surgery anywhere other than home, please be sure to leave us  the number where you can be reached.  2) Go directly home and rest.  3) Keep the operated foot(feet) elevated six inches above the hip when sitting or lying down. This will help control swelling and pain.  4) Support the elevated foot and leg with pillows. DO NOT PLACE PILLOWS UNDER THE KNEE.  5) DO NOT REMOVE or get your bandages WET, unless you were given different instructions by your doctor to do so. This increases the risk of infection.  6) Wear your surgical shoe or surgical boot at all times when you are up on your feet.  7) A limited amount of pain and swelling may occur. The skin may take on a bruised appearance. DO NOT BE ALARMED, THIS IS NORMAL.  8) For slight pain and swelling, apply an ice pack directly over the bandages for 15 minutes only out of each hour of the day. Continue until seen in the office for your first post op visit. DON NOT     APPLY ANY FORM OF HEAT TO THE AREA.  9) Have prescriptions filled immediately and take as directed.  10) Drink lots of liquids, water and juice to stay hydrated.  11) CALL IMMEDIATELY IF:  *Bleeding continues until the following day of surgery  *Pain increases and/or does not respond to medication  *Bandages or cast appears to tight  *If your bandage gets wet  *Trip, fall or stump your surgical foot  *If your temperature goes above 101  *If you have ANY questions at all  YOU NOW CONTROL THE EFFORT OF YOUR RECOVERY. ADHERING TO THESE INSTRUCTIONS WILL OFFER YOU THE MOST COMPLETE RESULTS

## 2025-01-15 NOTE — Anesthesia Preprocedure Evaluation (Addendum)
"                                    Anesthesia Evaluation  Patient identified by MRN, date of birth, ID band Patient awake    Reviewed: Allergy & Precautions, NPO status , Patient's Chart, lab work & pertinent test results  History of Anesthesia Complications Negative for: history of anesthetic complications  Airway Mallampati: III   Neck ROM: Full    Dental  (+) Missing   Pulmonary neg pulmonary ROS   Pulmonary exam normal breath sounds clear to auscultation       Cardiovascular hypertension, Normal cardiovascular exam Rhythm:Regular Rate:Normal  ECG 01/09/25: Sinus bradycardia with 1st degree A-V block with occasional Premature ventricular complexes Otherwise normal ECG   Neuro/Psych  PSYCHIATRIC DISORDERS Anxiety     negative neurological ROS     GI/Hepatic ,GERD  ,,  Endo/Other  Hypothyroidism    Renal/GU Renal disease (stage III CKD)     Musculoskeletal  (+) Arthritis ,    Abdominal   Peds  Hematology negative hematology ROS (+)   Anesthesia Other Findings   Reproductive/Obstetrics                              Anesthesia Physical Anesthesia Plan  ASA: 2  Anesthesia Plan: General   Post-op Pain Management:    Induction: Intravenous  PONV Risk Score and Plan: 3 and Propofol  infusion, TIVA, Treatment may vary due to age or medical condition and Ondansetron   Airway Management Planned: Natural Airway  Additional Equipment:   Intra-op Plan:   Post-operative Plan:   Informed Consent: I have reviewed the patients History and Physical, chart, labs and discussed the procedure including the risks, benefits and alternatives for the proposed anesthesia with the patient or authorized representative who has indicated his/her understanding and acceptance.   Patient has DNR.  Discussed DNR with patient and Continue DNR.     Plan Discussed with: CRNA  Anesthesia Plan Comments: (LMA/GETA backup discussed.  Patient  consented for risks of anesthesia including but not limited to:  - adverse reactions to medications - damage to eyes, teeth, lips or other oral mucosa - nerve damage due to positioning  - sore throat or hoarseness - damage to heart, brain, nerves, lungs, other parts of body or loss of life  Informed patient about role of CRNA in peri- and intra-operative care.  Patient voiced understanding.)         Anesthesia Quick Evaluation  "

## 2025-01-16 NOTE — Op Note (Signed)
 Surgeon: Surgeon(s): Tobie Franky SQUIBB, DPM  Assistants: None Pre-operative diagnosis: Bunion [id number]  Post-operative diagnosis: same Procedure: Procedures (LRB): FUSION, JOINT, GREAT TOE LEFT (Left)  Pathology: * No specimens in log *  Pertinent Intra-op findings: Moderate to severe osteoarthritis noted of the first MPJ.  Gouty tophi noted Anesthesia: Monitor Anesthesia Care  Hemostasis:  Total Tourniquet Time Documented: Calf (Left) - 52 minutes Total: Calf (Left) - 52 minutes  EBL: 10 cc Materials: Acumed plates and screws, 3-0 Monocryl, 3-0 Prolene Injectables: None Complications: None  Indications for surgery: A 89 y.o. female presents with left moderate to severe osteoarthritis of the first metatarsophalangeal joint painful. Patient has failed all conservative therapy including but not limited to shoe gear modification padding protecting offloading injection. She wishes to have surgical correction of the foot/deformity. It was determined that patient would benefit from left first metatarsophalangeal joint fusion. Informed surgical risk consent was reviewed and read aloud to the patient.  I reviewed the films.  I have discussed my findings with the patient in great detail.  I have discussed all risks including but not limited to infection, stiffness, scarring, limp, disability, deformity, damage to blood vessels and nerves, numbness, poor healing, need for braces, arthritis, chronic pain, amputation, death.  All benefits and realistic expectations discussed in great detail.  I have made no promises as to the outcome.  I have provided realistic expectations.  I have offered the patient a 2nd opinion, which they have declined and assured me they preferred to proceed despite the risks   Procedure in detail: The patient was both verbally and visually identified by myself, the nursing staff, and anesthesia staff in the preoperative holding area. They were then transferred to the operating room  and placed on the operative table in supine position.  Attention was directed to the dorsal aspect of the left  first metatarsophalangeal joint where a linear skin incision approximately 6cm long was made in the skin using a #15 blade. This incision was carried down through the subcutaneous tissue taking care to clamp and cauterize all neurovascular structures as necessary.  The capsule of 1st MPJ was identified. A linear capsulotomy was then performed in-line with the original skin incision and the capsule was reflected both medially and laterally to expose the head of the first metatarsal and the base of the proximal phalanx. The articular surface of the 1st metatarsal head was noted to show evidence ofextensive degeneration under direct visualization. The sagittal saw was then used to resect the dorsal, medial, and lateral prominences off of the 1st metatarsal. A rongeur was utilized to remove the dorsal exostosis of the proximal phalanx. A curette was used to remove some of the cartilage, and the remaining cartilage was removed using a burr until punctate bleeding could be noted on the metatarsal head and base of proximal phalanx. The site as flushed with copious amounts of sterile saline.  The distal aspect of the 1st metatarsal and the base of the proximal phalanx were then subchondrally drilled utilizing a pineapple burr. A Temporary K wire was then placed from distal-medial to proximal-lateral across the 1st MPJ, and the position was confirmed to be satisfactory utilizing fluoroscopy.. A dorsal right1st MPJ fusion locking plate was then applied and secured with two olive wires.  formal medical plate was used to hold fixation..The position was confirmed to be satisfactory with fluoroscopy, and two screws were placed both proximally and distally for a total of four Nonlocking/locking screws utilized. however prior to putting the  screws in a lag screw was inserted in standard technique 1.The 1st MPJ was then  stressed intraoperatively and no motion or gapping was noted across the fusion site. Final imaging via fluoroscopy was taken to confirm adequate placement and rectus position of the hallux. The surgical site was copiously irrigated with sterile saline. The capsule was then reapproximated with 3-0 Monocortical in a simple interrupted fashion. The subcutaneous tissue was then reapproximated with 4-0 monocryl in a running fashion. The subcuticular was performed utilizing 5-0 Monocryl.   At the conclusion of the procedure the patient was awoken from anesthesia and found to have tolerated the procedure well any complications. There were transferred to PACU with vital signs stable and vascular status intact.  Donnavan Covault, DPM

## 2025-01-18 ENCOUNTER — Encounter: Payer: Self-pay | Admitting: Podiatry

## 2025-01-22 ENCOUNTER — Encounter: Admitting: Podiatry

## 2025-01-24 ENCOUNTER — Ambulatory Visit (INDEPENDENT_AMBULATORY_CARE_PROVIDER_SITE_OTHER)

## 2025-01-24 ENCOUNTER — Ambulatory Visit: Admitting: Podiatry

## 2025-01-24 ENCOUNTER — Other Ambulatory Visit: Payer: Self-pay | Admitting: Podiatry

## 2025-01-24 DIAGNOSIS — M2012 Hallux valgus (acquired), left foot: Secondary | ICD-10-CM

## 2025-01-24 DIAGNOSIS — M21612 Bunion of left foot: Secondary | ICD-10-CM

## 2025-01-24 DIAGNOSIS — M21619 Bunion of unspecified foot: Secondary | ICD-10-CM

## 2025-01-24 DIAGNOSIS — Z9889 Other specified postprocedural states: Secondary | ICD-10-CM

## 2025-01-24 MED ORDER — DOXYCYCLINE HYCLATE 100 MG PO TABS: 100.0000 mg | ORAL_TABLET | Freq: Two times a day (BID) | ORAL | 0 refills | Status: AC

## 2025-02-05 ENCOUNTER — Encounter: Admitting: Podiatry
